# Patient Record
Sex: Female | Born: 1980 | Race: Black or African American | Hispanic: No | Marital: Single
Health system: Midwestern US, Community
[De-identification: ages and names within clinical notes are randomized; demographics above are authoritative.]

## PROBLEM LIST (undated history)

## (undated) DIAGNOSIS — M329 Systemic lupus erythematosus, unspecified: Secondary | ICD-10-CM

## (undated) DIAGNOSIS — G894 Chronic pain syndrome: Secondary | ICD-10-CM

## (undated) DIAGNOSIS — IMO0002 Reserved for concepts with insufficient information to code with codable children: Secondary | ICD-10-CM

## (undated) DIAGNOSIS — J301 Allergic rhinitis due to pollen: Secondary | ICD-10-CM

## (undated) DIAGNOSIS — R52 Pain, unspecified: Secondary | ICD-10-CM

## (undated) DIAGNOSIS — D500 Iron deficiency anemia secondary to blood loss (chronic): Secondary | ICD-10-CM

## (undated) DIAGNOSIS — I10 Essential (primary) hypertension: Secondary | ICD-10-CM

## (undated) DIAGNOSIS — R000 Tachycardia, unspecified: Secondary | ICD-10-CM

## (undated) DIAGNOSIS — Z862 Personal history of diseases of the blood and blood-forming organs and certain disorders involving the immune mechanism: Secondary | ICD-10-CM

## (undated) DIAGNOSIS — R110 Nausea: Secondary | ICD-10-CM

## (undated) DIAGNOSIS — F5101 Primary insomnia: Secondary | ICD-10-CM

## (undated) DIAGNOSIS — Z888 Allergy status to other drugs, medicaments and biological substances status: Principal | ICD-10-CM

## (undated) DIAGNOSIS — D649 Anemia, unspecified: Secondary | ICD-10-CM

## (undated) DIAGNOSIS — Z85830 Personal history of malignant neoplasm of bone: Secondary | ICD-10-CM

## (undated) DIAGNOSIS — E782 Mixed hyperlipidemia: Secondary | ICD-10-CM

## (undated) DIAGNOSIS — Z87898 Personal history of other specified conditions: Secondary | ICD-10-CM

## (undated) DIAGNOSIS — Z01818 Encounter for other preprocedural examination: Secondary | ICD-10-CM

## (undated) DIAGNOSIS — I7300 Raynaud's syndrome without gangrene: Secondary | ICD-10-CM

## (undated) DIAGNOSIS — E559 Vitamin D deficiency, unspecified: Secondary | ICD-10-CM

## (undated) DIAGNOSIS — M62838 Other muscle spasm: Principal | ICD-10-CM

## (undated) DIAGNOSIS — R569 Unspecified convulsions: Principal | ICD-10-CM

## (undated) DIAGNOSIS — F419 Anxiety disorder, unspecified: Secondary | ICD-10-CM

## (undated) DIAGNOSIS — S81802A Unspecified open wound, left lower leg, initial encounter: Secondary | ICD-10-CM

## (undated) DIAGNOSIS — R5382 Chronic fatigue, unspecified: Principal | ICD-10-CM

## (undated) DIAGNOSIS — D509 Iron deficiency anemia, unspecified: Secondary | ICD-10-CM

## (undated) DIAGNOSIS — G4701 Insomnia due to medical condition: Secondary | ICD-10-CM

---

## 2010-07-26 IMAGING — CR DG SHOULDER 3+V*L*
1 series · 3 of 3 positions shown · non-contrast
Comparison: none

REASON FOR EXAM: pain, unable o lift arm
COMMENTS:

[Series 1: view not recorded · 0.17mm/px · 3 of 3 slices shown]
[im 1/3]
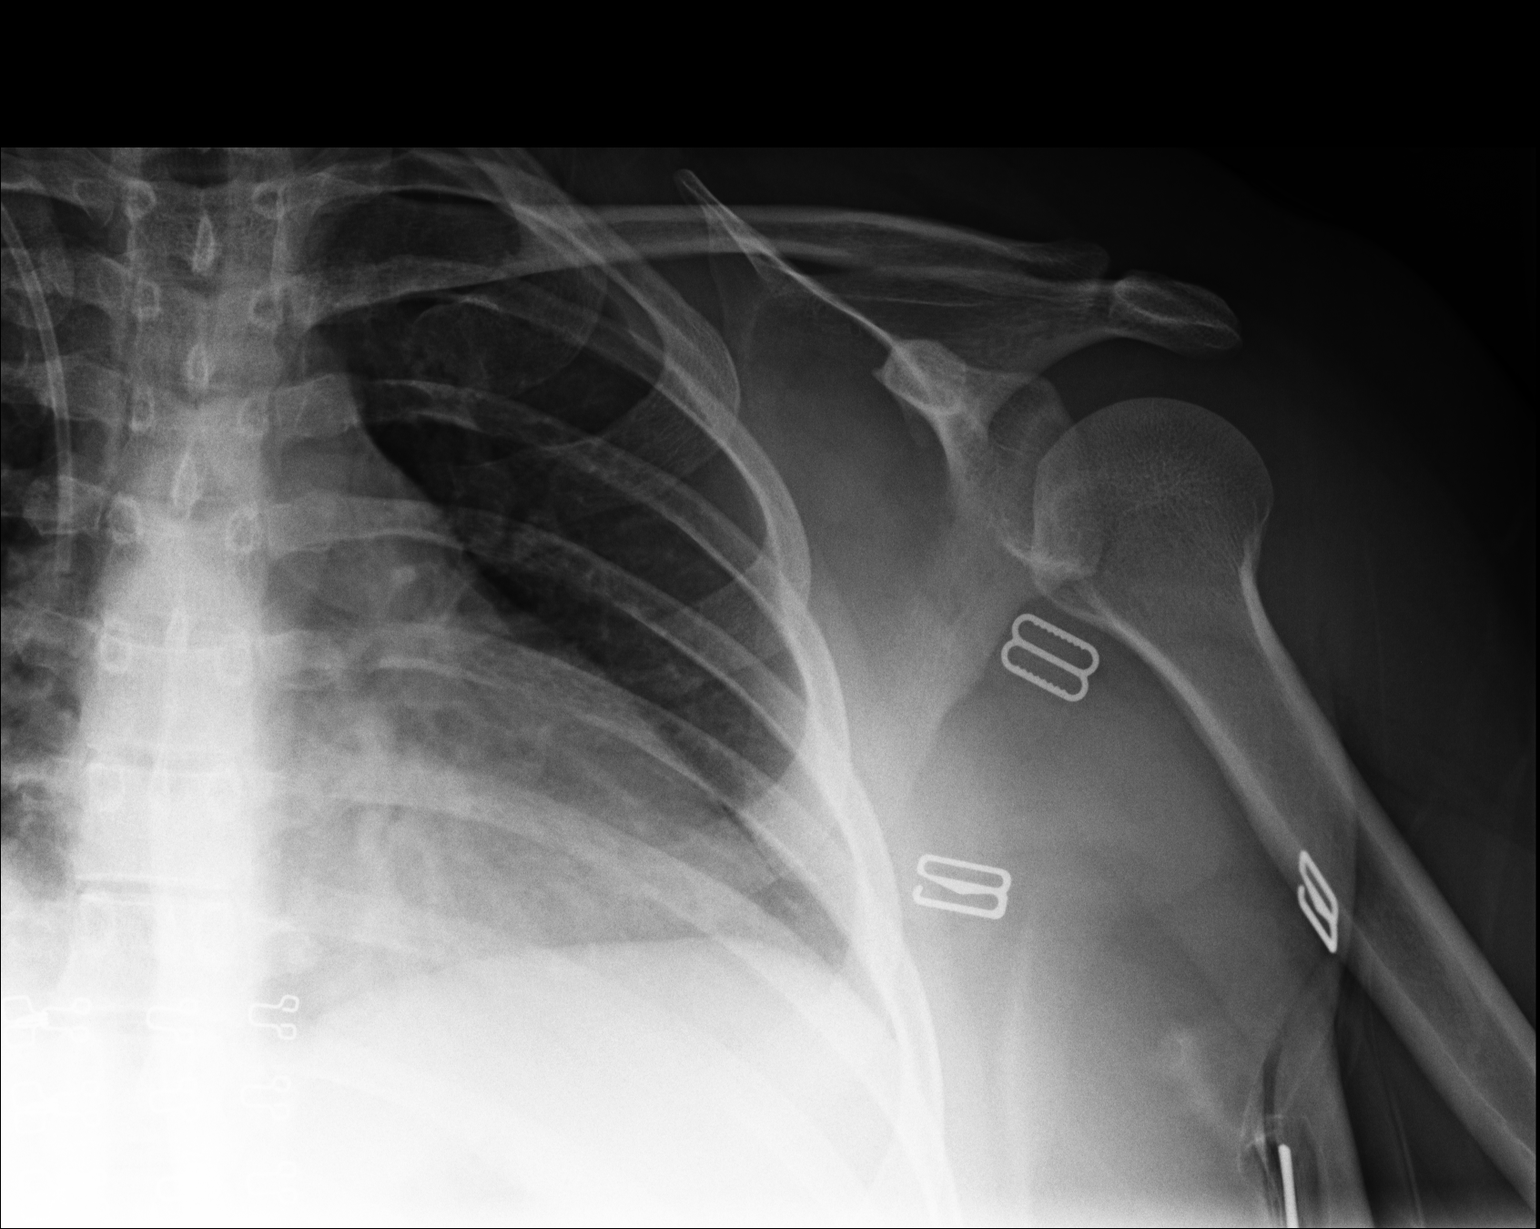
[im 2/3]
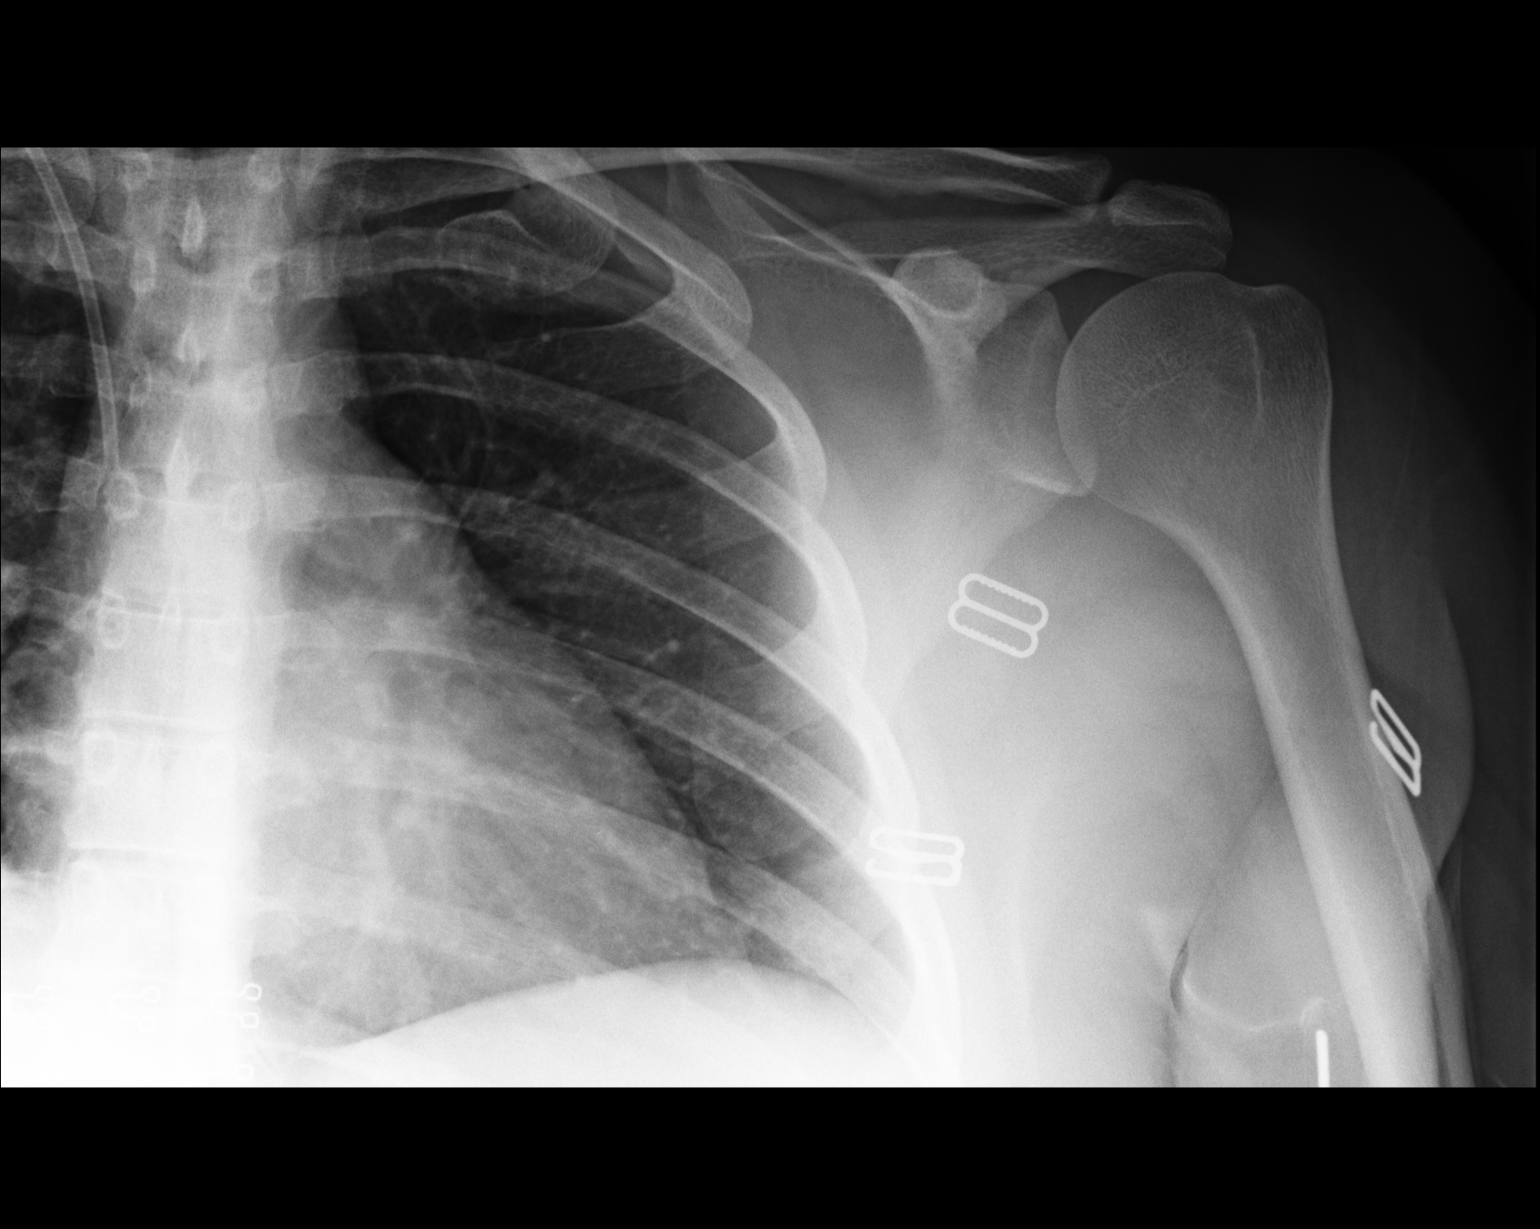
[im 3/3]
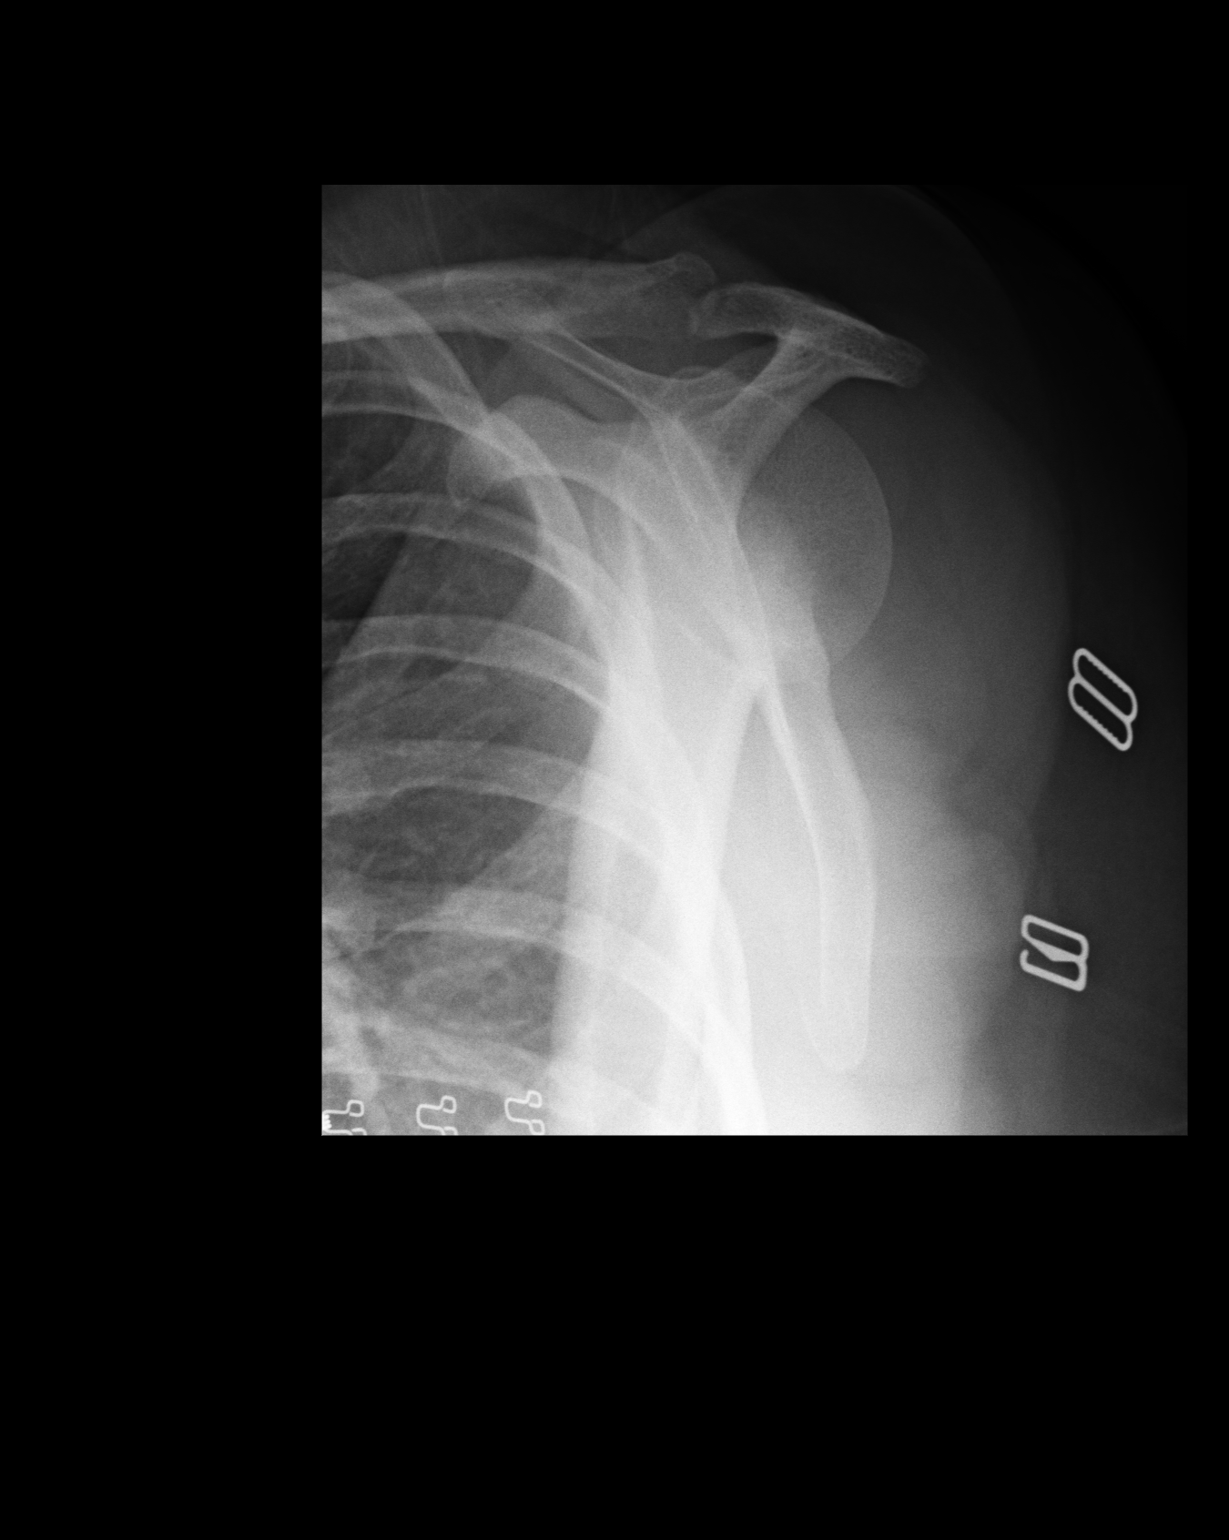

[3 of 3 positions shown; findings below may reference images not displayed]

PROCEDURE:     MDR - MDR SHOULDER LEFT COMPLETE  - January 29, 2009  [DATE]

RESULT:     There does not appear to be evidence of fracture, dislocation,
or malalignment.  The LEFT lung apex is unremarkable. If there is persistent
complaints of pain, repeat evaluation in 7-10 days is recommended or,
alternatively further evaluation with MRI.
IMPRESSION: 1. Unremarkable LEFT shoulder.

## 2021-05-15 ENCOUNTER — Inpatient Hospital Stay
Admit: 2021-05-15 | Discharge: 2021-05-15 | Disposition: A | Discharge status: Home / Self Care | Payer: Medicaid (Managed Care) | Attending: Emergency Medicine

## 2021-05-15 ENCOUNTER — Emergency Department: Admit: 2021-05-15 | Payer: Medicaid (Managed Care)

## 2021-05-15 DIAGNOSIS — S81812A Laceration without foreign body, left lower leg, initial encounter: Secondary | ICD-10-CM

## 2021-05-15 LAB — CBC WITH AUTO DIFFERENTIAL
Absolute Eos #: 0.1 10*3/uL (ref 0.0–0.8)
Absolute Immature Granulocyte: 0 10*3/uL (ref 0.0–0.5)
Absolute Lymph #: 1.3 10*3/uL (ref 0.5–4.6)
Absolute Mono #: 0.5 10*3/uL (ref 0.1–1.3)
Basophils Absolute: 0 10*3/uL (ref 0.0–0.2)
Basophils: 1 % (ref 0.0–2.0)
Eosinophils %: 2 % (ref 0.5–7.8)
Hematocrit: 35.7 % — ABNORMAL LOW (ref 35.8–46.3)
Hemoglobin: 11.2 g/dL — ABNORMAL LOW (ref 11.7–15.4)
Immature Granulocytes: 0 % (ref 0.0–5.0)
Lymphocytes: 25 % (ref 13–44)
MCH: 29.5 PG (ref 26.1–32.9)
MCHC: 31.4 g/dL (ref 31.4–35.0)
MCV: 93.9 FL (ref 79.6–97.8)
MPV: 10.4 FL (ref 9.4–12.3)
Monocytes: 10 % (ref 4.0–12.0)
Platelets: 344 10*3/uL (ref 150–450)
RBC: 3.8 M/uL — ABNORMAL LOW (ref 4.05–5.2)
RDW: 13.6 % (ref 11.9–14.6)
Seg Neutrophils: 62 % (ref 43–78)
Segs Absolute: 3.2 10*3/uL (ref 1.7–8.2)
WBC: 5.2 10*3/uL (ref 4.3–11.1)
nRBC: 0 10*3/uL (ref 0.0–0.2)

## 2021-05-15 LAB — BASIC METABOLIC PANEL
Anion Gap: 2 mmol/L — ABNORMAL LOW (ref 4–13)
BUN: 11 MG/DL (ref 6–23)
CO2: 28 mmol/L (ref 21–32)
Calcium: 9.8 MG/DL (ref 8.3–10.4)
Chloride: 108 mmol/L (ref 101–110)
Creatinine: 0.8 MG/DL (ref 0.6–1.0)
GFR African American: >60 mL/min/{1.73_m2} (ref >60)
GFR Non-African American: >60 mL/min/{1.73_m2} (ref >60)
Glucose: 107 mg/dL — ABNORMAL HIGH (ref 65–100)
Potassium: 4.6 mmol/L (ref 3.5–5.1)
Sodium: 138 mmol/L (ref 136–145)

## 2021-05-15 MED ORDER — LORAZEPAM 2 MG/ML IJ SOLN
2 MG/ML | Freq: Once | INTRAMUSCULAR | Status: AC
Start: 2021-05-15 — End: 2021-05-15
  Administered 2021-05-15: 14:00:00 1 mg via INTRAVENOUS

## 2021-05-15 MED ORDER — SODIUM CHLORIDE 0.9 % IV BOLUS
0.9 % | Freq: Once | INTRAVENOUS | Status: AC | PRN
Start: 2021-05-15 — End: 2021-05-15
  Administered 2021-05-15: 18:00:00 100 mL via INTRAVENOUS

## 2021-05-15 MED ORDER — MORPHINE SULFATE 2 MG/ML IJ SOLN
2 MG/ML | Freq: Once | INTRAMUSCULAR | Status: AC
Start: 2021-05-15 — End: 2021-05-15
  Administered 2021-05-15: 15:00:00 4 mg via INTRAVENOUS

## 2021-05-15 MED ORDER — NORMAL SALINE FLUSH 0.9 % IV SOLN
0.9 % | Freq: Once | INTRAVENOUS | Status: AC | PRN
Start: 2021-05-15 — End: 2021-05-15
  Administered 2021-05-15: 18:00:00 10 mL via INTRAVENOUS

## 2021-05-15 MED ORDER — ORPHENADRINE CITRATE ER 100 MG PO TB12
100 MG | Freq: Two times a day (BID) | ORAL | Status: DC
Start: 2021-05-15 — End: 2021-05-15
  Administered 2021-05-15: 14:00:00 100 mg via ORAL

## 2021-05-15 MED ORDER — CEPHALEXIN 500 MG PO CAPS
500 MG | ORAL_CAPSULE | Freq: Two times a day (BID) | ORAL | 0 refills | Status: DC
Start: 2021-05-15 — End: 2021-05-17

## 2021-05-15 MED ORDER — LIDOCAINE-EPINEPHRINE 1 %-1:200000 IJ SOLN
1 percent-:200000 | Freq: Once | INTRAMUSCULAR | Status: AC
Start: 2021-05-15 — End: 2021-05-15
  Administered 2021-05-15: 14:00:00 30 mL via INTRADERMAL

## 2021-05-15 MED ORDER — LACTATED RINGERS IV BOLUS
Freq: Once | INTRAVENOUS | Status: AC
Start: 2021-05-15 — End: 2021-05-15
  Administered 2021-05-15: 14:00:00 1000 mL via INTRAVENOUS

## 2021-05-15 MED ORDER — HYDROMORPHONE HCL PF 1 MG/ML IJ SOLN
1 MG/ML | INTRAMUSCULAR | Status: AC
Start: 2021-05-15 — End: 2021-05-15
  Administered 2021-05-15: 17:00:00 1 mg via INTRAVENOUS

## 2021-05-15 MED ORDER — HYDROCODONE-ACETAMINOPHEN 5-325 MG PO TABS
5-325 MG | ORAL_TABLET | Freq: Four times a day (QID) | ORAL | 0 refills | Status: AC | PRN
Start: 2021-05-15 — End: 2021-05-18

## 2021-05-15 MED ORDER — CLINDAMYCIN PHOSPHATE IN D5W 600 MG/50ML IV SOLN
60050 MG/50ML | INTRAVENOUS | Status: AC
Start: 2021-05-15 — End: 2021-05-15
  Administered 2021-05-15: 19:00:00 600 mg via INTRAVENOUS

## 2021-05-15 MED ORDER — DROPERIDOL 2.5 MG/ML IJ SOLN
2.5 MG/ML | Freq: Once | INTRAMUSCULAR | Status: AC
Start: 2021-05-15 — End: 2021-05-15
  Administered 2021-05-15: 17:00:00 5 mg via INTRAVENOUS

## 2021-05-15 MED ORDER — MORPHINE SULFATE 4 MG/ML IV SOLN
4 MG/ML | Freq: Once | INTRAVENOUS | Status: AC
Start: 2021-05-15 — End: 2021-05-15
  Administered 2021-05-15: 14:00:00 4 mg via INTRAVENOUS

## 2021-05-15 MED ORDER — CLINDAMYCIN HCL 300 MG PO CAPS
300 MG | ORAL_CAPSULE | Freq: Two times a day (BID) | ORAL | 0 refills | Status: DC
Start: 2021-05-15 — End: 2021-05-17

## 2021-05-15 MED ORDER — IOPAMIDOL 76 % IV SOLN
76 % | Freq: Once | INTRAVENOUS | Status: AC | PRN
Start: 2021-05-15 — End: 2021-05-15
  Administered 2021-05-15: 18:00:00 100 mL via INTRAVENOUS

## 2021-05-15 MED ORDER — DIPHENHYDRAMINE HCL 50 MG/ML IJ SOLN
50 MG/ML | Freq: Once | INTRAMUSCULAR | Status: AC
Start: 2021-05-15 — End: 2021-05-15
  Administered 2021-05-15: 17:00:00 50 mg via INTRAVENOUS

## 2021-05-15 MED FILL — CLINDAMYCIN PHOSPHATE IN D5W 600 MG/50ML IV SOLN: 600 MG/50ML | INTRAVENOUS | Qty: 50

## 2021-05-15 MED FILL — ORPHENADRINE CITRATE ER 100 MG PO TB12: 100 MG | ORAL | Qty: 1

## 2021-05-15 MED FILL — LORAZEPAM 2 MG/ML IJ SOLN: 2 MG/ML | INTRAMUSCULAR | Qty: 1

## 2021-05-15 MED FILL — MORPHINE SULFATE 2 MG/ML IJ SOLN: 2 mg/mL | INTRAMUSCULAR | Qty: 2

## 2021-05-15 MED FILL — MORPHINE SULFATE 4 MG/ML IJ SOLN: 4 mg/mL | INTRAMUSCULAR | Qty: 1

## 2021-05-15 MED FILL — DROPERIDOL 2.5 MG/ML IJ SOLN: 2.5 MG/ML | INTRAMUSCULAR | Qty: 2

## 2021-05-15 MED FILL — XYLOCAINE-MPF/EPINEPHRINE 1 %-1:200000 IJ SOLN: 1 %-:200000 | INTRAMUSCULAR | Qty: 30

## 2021-05-15 MED FILL — DIPHENHYDRAMINE HCL 50 MG/ML IJ SOLN: 50 MG/ML | INTRAMUSCULAR | Qty: 1

## 2021-05-15 MED FILL — HYDROMORPHONE HCL 1 MG/ML IJ SOLN: 1 MG/ML | INTRAMUSCULAR | Qty: 1
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
## 2021-05-15 NOTE — Discharge Instructions (Addendum)
We have given you follow-up with our plastic surgeon on-call Dr. Allene Dillon.  I have written you for clindamycin for home which is an antibiotic you take twice a day for 7 days.  Please return the emergency department for any fevers vomiting red streaking up your leg or worsening pain.  Have also written you for some pain medication at home.  Please be aware that this medication is addicting you should only use ibuprofen and Tylenol and this medicine for breakthrough pain.  You should also be on a daily stool softener when taking this medication.  I recommend you are nonweightbearing to allow this wound to heal.

## 2021-05-15 NOTE — ED Notes (Signed)
I have reviewed discharge instructions with the patient and friend.  The patient and friend verbalized understanding.    Patient left ED via Discharge Method: wheelchair to Home with friend.    Opportunity for questions and clarification provided.       Patient given 2 scripts.         To continue your aftercare when you leave the hospital, you may receive an automated call from our care team to check in on how you are doing.  This is a free service and part of our promise to provide the best care and service to meet your aftercare needs.??? If you have questions, or wish to unsubscribe from this service please call [phone].  Thank you for Choosing our [facility] Emergency Department.        Marylyn Ishihara, RN  05/15/21 [phone]
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
## 2021-05-17 ENCOUNTER — Inpatient Hospital Stay
Admit: 2021-05-17 | Discharge: 2021-05-18 | Disposition: A | Discharge status: Home / Self Care | Payer: Medicaid (Managed Care) | Attending: Emergency Medicine

## 2021-05-17 DIAGNOSIS — Z4800 Encounter for change or removal of nonsurgical wound dressing: Secondary | ICD-10-CM

## 2021-05-17 LAB — CBC WITH AUTO DIFFERENTIAL
Absolute Eos #: 0.1 10*3/uL (ref 0.0–0.8)
Absolute Immature Granulocyte: 0 10*3/uL (ref 0.0–0.5)
Absolute Lymph #: 1.2 10*3/uL (ref 0.5–4.6)
Absolute Mono #: 0.4 10*3/uL (ref 0.1–1.3)
Basophils Absolute: 0 10*3/uL (ref 0.0–0.2)
Basophils: 1 % (ref 0.0–2.0)
Eosinophils %: 2 % (ref 0.5–7.8)
Hematocrit: 34 % — ABNORMAL LOW (ref 35.8–46.3)
Hemoglobin: 10.3 g/dL — ABNORMAL LOW (ref 11.7–15.4)
Immature Granulocytes: 0 % (ref 0.0–5.0)
Lymphocytes: 19 % (ref 13–44)
MCH: 29.1 PG (ref 26.1–32.9)
MCHC: 30.3 g/dL — ABNORMAL LOW (ref 31.4–35.0)
MCV: 96 FL (ref 79.6–97.8)
MPV: 11.1 FL (ref 9.4–12.3)
Monocytes: 7 % (ref 4.0–12.0)
Platelets: 364 10*3/uL (ref 150–450)
RBC: 3.54 M/uL — ABNORMAL LOW (ref 4.05–5.2)
RDW: 13.5 % (ref 11.9–14.6)
Seg Neutrophils: 71 % (ref 43–78)
Segs Absolute: 4.4 10*3/uL (ref 1.7–8.2)
WBC: 6.1 10*3/uL (ref 4.3–11.1)
nRBC: 0 10*3/uL (ref 0.0–0.2)

## 2021-05-17 LAB — COMPREHENSIVE METABOLIC PANEL
ALT: 22 U/L (ref 12–65)
AST: 12 U/L — ABNORMAL LOW (ref 15–37)
Albumin/Globulin Ratio: 0.9 — ABNORMAL LOW (ref 1.2–3.5)
Albumin: 3.5 g/dL (ref 3.5–5.0)
Alk Phosphatase: 75 U/L (ref 50–136)
Anion Gap: 5 mmol/L (ref 4–13)
BUN: 10 MG/DL (ref 6–23)
CO2: 25 mmol/L (ref 21–32)
Calcium: 9.4 MG/DL (ref 8.3–10.4)
Chloride: 107 mmol/L (ref 101–110)
Creatinine: 0.9 MG/DL (ref 0.6–1.0)
GFR African American: >60 mL/min/{1.73_m2} (ref >60)
GFR Non-African American: >60 mL/min/{1.73_m2} (ref >60)
Globulin: 4.1 g/dL — ABNORMAL HIGH (ref 2.3–3.5)
Glucose: 89 mg/dL (ref 65–100)
Potassium: 4.1 mmol/L (ref 3.5–5.1)
Sodium: 137 mmol/L (ref 136–145)
Total Bilirubin: 0.2 MG/DL (ref 0.2–1.1)
Total Protein: 7.6 g/dL (ref 6.3–8.2)

## 2021-05-17 LAB — LACTIC ACID: Lactic Acid, Plasma: 2 MMOL/L (ref 0.4–2.0)

## 2021-05-17 LAB — PROCALCITONIN: Procalcitonin: <0.05 ng/mL (ref 0.00–0.49)

## 2021-05-17 MED ORDER — CEPHALEXIN 500 MG PO CAPS
500 MG | ORAL_CAPSULE | Freq: Two times a day (BID) | ORAL | 0 refills | Status: AC
Start: 2021-05-17 — End: 2021-05-20

## 2021-05-17 MED ORDER — HYDROMORPHONE HCL PF 1 MG/ML IJ SOLN
1 MG/ML | INTRAMUSCULAR | Status: AC
Start: 2021-05-17 — End: 2021-05-17
  Administered 2021-05-17: 0.5 mg via INTRAVENOUS

## 2021-05-17 MED ORDER — SODIUM CHLORIDE 0.9 % IV BOLUS
0.9 % | INTRAVENOUS | Status: AC
Start: 2021-05-17 — End: 2021-05-17
  Administered 2021-05-17: 20:00:00 1000 mL via INTRAVENOUS

## 2021-05-17 MED ORDER — DIAZEPAM 5 MG/ML IJ SOLN
5 MG/ML | Freq: Once | INTRAMUSCULAR | Status: AC
Start: 2021-05-17 — End: 2021-05-17
  Administered 2021-05-17: 20:00:00 2.5 mg via INTRAVENOUS

## 2021-05-17 MED ORDER — DIAZEPAM 5 MG/ML IJ SOLN
5 MG/ML | INTRAMUSCULAR | Status: DC | PRN
Start: 2021-05-17 — End: 2021-05-17
  Administered 2021-05-17: 21:00:00 2.5 mg via INTRAVENOUS

## 2021-05-17 MED ORDER — HYDROMORPHONE HCL PF 1 MG/ML IJ SOLN
1 MG/ML | INTRAMUSCULAR | Status: AC
Start: 2021-05-17 — End: 2021-05-17
  Administered 2021-05-17: 21:00:00 1 mg via INTRAVENOUS

## 2021-05-17 MED ORDER — DIAZEPAM 2 MG PO TABS
2 MG | Freq: Four times a day (QID) | ORAL | Status: DC | PRN
Start: 2021-05-17 — End: 2021-05-17
  Administered 2021-05-17: 2 mg via ORAL

## 2021-05-17 MED ORDER — BACLOFEN 10 MG PO TABS
10 MG | ORAL_TABLET | Freq: Three times a day (TID) | ORAL | 0 refills | Status: AC
Start: 2021-05-17 — End: 2021-05-22

## 2021-05-17 MED ORDER — HYDROMORPHONE HCL PF 1 MG/ML IJ SOLN
1 MG/ML | INTRAMUSCULAR | Status: AC
Start: 2021-05-17 — End: 2021-05-17
  Administered 2021-05-17: 23:00:00 0.5 mg via INTRAVENOUS

## 2021-05-17 MED ORDER — OXYCODONE HCL 5 MG PO TABS
5 MG | ORAL_TABLET | Freq: Four times a day (QID) | ORAL | 0 refills | Status: AC | PRN
Start: 2021-05-17 — End: 2021-05-20

## 2021-05-17 MED ORDER — DIPHENHYDRAMINE HCL 50 MG/ML IJ SOLN
50 MG/ML | Freq: Four times a day (QID) | INTRAMUSCULAR | Status: DC | PRN
Start: 2021-05-17 — End: 2021-05-17
  Administered 2021-05-17: 23:00:00 25 mg via INTRAVENOUS

## 2021-05-17 MED ORDER — BACLOFEN 10 MG PO TABS
10 MG | ORAL | Status: AC
Start: 2021-05-17 — End: 2021-05-17
  Administered 2021-05-17: 22:00:00 5 mg via ORAL

## 2021-05-17 MED ORDER — CLINDAMYCIN HCL 300 MG PO CAPS
300 MG | ORAL_CAPSULE | Freq: Two times a day (BID) | ORAL | 0 refills | Status: AC
Start: 2021-05-17 — End: 2021-05-20

## 2021-05-17 MED ORDER — HYDROMORPHONE HCL PF 1 MG/ML IJ SOLN
1 MG/ML | Freq: Once | INTRAMUSCULAR | Status: AC
Start: 2021-05-17 — End: 2021-05-17
  Administered 2021-05-17: 20:00:00 0.25 mg via INTRAVENOUS

## 2021-05-17 MED FILL — HYDROMORPHONE HCL 1 MG/ML IJ SOLN: 1 MG/ML | INTRAMUSCULAR | Qty: 1

## 2021-05-17 MED FILL — DIAZEPAM 5 MG/ML IJ SOLN: 5 MG/ML | INTRAMUSCULAR | Qty: 2

## 2021-05-17 MED FILL — DIPHENHYDRAMINE HCL 50 MG/ML IJ SOLN: 50 MG/ML | INTRAMUSCULAR | Qty: 1

## 2021-05-17 MED FILL — BACLOFEN 10 MG PO TABS: 10 MG | ORAL | Qty: 1

## 2021-05-17 MED FILL — DIAZEPAM 2 MG PO TABS: 2 MG | ORAL | Qty: 1

## 2021-05-17 NOTE — ED Triage Notes (Cosign Needed)
41 year old female patient presents today for a wound check after having multiple sutures placed on her left lower leg 2 days ago.  She said since then her leg has been nonstop twitching.  She also states that it is starting to turn black and is very painful.  She denies any red streaking up her leg.  Denies fever or chills.    Physical exam shows middle-aged female in mild pain.  Left lower leg is nonstop repeatedly twitching and fasciculating.  Pictures shown of leg to show necrosis over area of flap.    Patient evaluated initially in triage.  Rapid Medical Evaluation was conducted and necessary orders have been placed.  I have performed a medical screening exam.  Care will now be transferred to the provider in the back of the emergency department.  Dontaye Hur, PA 12:22 PM

## 2021-05-17 NOTE — Discharge Instructions (Signed)
Recheck with any fever or unusual drainage  Complete 10-day course of both antibiotics  Use probiotic or culture active yogurt  There is an area of darkened tissue that may fail but that can be followed up with plastic surgery/wound care as it defines itself

## 2021-05-17 NOTE — ED Triage Notes (Signed)
Pt arrives via POV coming from home c/o complications from sutures that were placed on Tuesday. Pt recently fell and came to this facility. Pt states she was instructed to return with worsening pain or with additional swelling. Pt states "part of the wound is black". No other complaints at time of triage.
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
## 2021-05-22 LAB — CULTURE, BLOOD 1: Culture: NO GROWTH

## 2021-05-31 ENCOUNTER — Encounter: Payer: BLUE CROSS/BLUE SHIELD | Attending: Family Medicine

## 2021-06-29 NOTE — Telephone Encounter (Signed)
Formatting of this note might be different from the original.  Has appointment Dr Althia Forts Nov 7.  js  Electronically signed by Carola Rhine, MD at 06/29/2021 10:41 AM EDT

## 2021-07-02 ENCOUNTER — Inpatient Hospital Stay: Admit: 2021-07-02 | Payer: BLUE CROSS/BLUE SHIELD

## 2021-07-02 DIAGNOSIS — Z01812 Encounter for preprocedural laboratory examination: Secondary | ICD-10-CM

## 2021-07-02 LAB — HEMOGLOBIN: Hemoglobin: 9.9 g/dL — ABNORMAL LOW (ref 11.7–15.4)

## 2021-07-02 NOTE — Other (Signed)
Phone pre-assessment completed.    Verified name&  DOB. Order to obtain consent NOT found in EHR, however patient verifies case posting.    Type 2 surgery,  assessment complete.  Orders NOT received.    Labs per surgeon: unknown  Labs per anesthesia protocol: HGB- Pt to come to [address] today before 3:30pm. Chart marked for charge nurse review.    Medical/surgical history questions answered at their best of ability. All prior to admission medications reviewed and documented in [facility].    Instructed to take ONLY THE FOLLOWING MEDICATIONS ON THE DAY OF SURGERY according to anesthesia guidelines with sips of water: sucralfate, gabapentin, if needed zofran.      VERBALIZES UNDERSTANDING TO HOLD ALL VITAMINS AND SUPPLEMENTS and NSAIDS IMMEDIATELY PER ANESTHESIA PROTOCOL.    Instructed on the following:    > Arrive at [facility], time of arrival to be called the day before by 1700  > NPO after midnight including gum, mints, and ice chips  > Responsible adult must drive patient to the hospital, stay during surgery, and patient will need supervision 24 hours after anesthesia  > Use antibacterial soap in shower the night before surgery and on the morning of surgery  > All piercings must be removed prior to arrival.    > Leave all valuables (money and jewelry) at home but bring insurance card and ID on DOS.   > You may be required to pay a deductible or co-pay on the day of your procedure. You can pre-pay by calling [phone] if your surgery is at the [facility] or [phone] if your surgery is at the [facility].  > Do not wear make-up, nail polish, lotions, cologne, perfumes, powders, or oil on skin. Artificial nails are not permitted.     Teach back successful and demonstrates knowledge of instruction.    You will received a call from the pre-op nurse by 5 pm on the business day prior to the scheduled procedure. If you have not spoken with a nurse, please check your voicemail. If you  have not received an arrival time by 5 pm, please call [phone].

## 2021-07-02 NOTE — Other (Addendum)
The lab results below are within anesthesia guidelines, labs routed to surgeon per anesthesia protocol.      Hemoglobin  Order: [id number]  Status: Final result    Visible to patient: No (not released)    Next appt: None    0 Result Notes  Component Ref Range & Units 07/02/21 1140    Hemoglobin 11.7 - 15.4 g/dL 9.9 Low     Resulting [place]              Specimen Collected: 07/02/21 11:40 EST Last Resulted: 07/02/21 12:49 EST

## 2021-07-04 ENCOUNTER — Inpatient Hospital Stay: Payer: BLUE CROSS/BLUE SHIELD

## 2021-07-04 LAB — POC PREGNANCY UR-QUAL: Preg Test, Ur: NEGATIVE

## 2021-07-04 MED ORDER — FENTANYL CITRATE (PF) 100 MCG/2ML IJ SOLN
1002 MCG/2ML | INTRAMUSCULAR | Status: AC
Start: 2021-07-04 — End: ?

## 2021-07-04 MED ORDER — ACETAMINOPHEN 500 MG PO TABS
500 MG | Freq: Once | ORAL | Status: AC
Start: 2021-07-04 — End: 2021-07-04
  Administered 2021-07-04: 11:00:00 1000 mg via ORAL

## 2021-07-04 MED ORDER — LIDOCAINE HCL (PF) 0.5 % IJ SOLN
0.5 % | INTRAMUSCULAR | Status: AC
Start: 2021-07-04 — End: ?

## 2021-07-04 MED ORDER — MIDAZOLAM HCL (PF) 2 MG/2ML IJ SOLN
22 MG/ML | Freq: Once | INTRAMUSCULAR | Status: AC | PRN
Start: 2021-07-04 — End: 2021-07-04
  Administered 2021-07-04: 12:00:00 2 mg via INTRAVENOUS

## 2021-07-04 MED ORDER — DIPHENHYDRAMINE HCL 50 MG/ML IJ SOLN
50 MG/ML | Freq: Once | INTRAMUSCULAR | Status: DC | PRN
Start: 2021-07-04 — End: 2021-07-04

## 2021-07-04 MED ORDER — DEXAMETHASONE SODIUM PHOSPHATE 10 MG/ML IJ SOLN
10 MG/ML | INTRAMUSCULAR | Status: DC | PRN
Start: 2021-07-04 — End: 2021-07-04
  Administered 2021-07-04: 13:00:00 10 via INTRAVENOUS

## 2021-07-04 MED ORDER — SODIUM CHLORIDE 0.9 % IV SOLN
0.9 % | INTRAVENOUS | Status: DC | PRN
Start: 2021-07-04 — End: 2021-07-04

## 2021-07-04 MED ORDER — ONDANSETRON HCL 4 MG/2ML IJ SOLN
42 MG/2ML | INTRAMUSCULAR | Status: DC | PRN
Start: 2021-07-04 — End: 2021-07-04
  Administered 2021-07-04: 13:00:00 4 via INTRAVENOUS

## 2021-07-04 MED ORDER — NEOSTIGMINE METHYLSULFATE 3 MG/3ML IV SOSY
33 MG/ML | INTRAVENOUS | Status: DC | PRN
Start: 2021-07-04 — End: 2021-07-04
  Administered 2021-07-04: 15:00:00 2 via INTRAVENOUS

## 2021-07-04 MED ORDER — FENTANYL CITRATE (PF) 100 MCG/2ML IJ SOLN
100 MCG/2ML | Freq: Once | INTRAMUSCULAR | Status: DC | PRN
Start: 2021-07-04 — End: 2021-07-04

## 2021-07-04 MED ORDER — NORMAL SALINE FLUSH 0.9 % IV SOLN
0.9 % | INTRAVENOUS | Status: DC | PRN
Start: 2021-07-04 — End: 2021-07-04

## 2021-07-04 MED ORDER — FENTANYL CITRATE (PF) 100 MCG/2ML IJ SOLN
1002 MCG/2ML | INTRAMUSCULAR | Status: DC | PRN
Start: 2021-07-04 — End: 2021-07-04
  Administered 2021-07-04: 12:00:00 100 via INTRAVENOUS

## 2021-07-04 MED ORDER — EPHEDRINE SULFATE-NACL 50-0.9 MG/5ML-% IV SOSY
INTRAVENOUS | Status: AC
Start: 2021-07-04 — End: ?

## 2021-07-04 MED ORDER — GLUCOSE 4 G PO CHEW
4 g | ORAL | Status: DC | PRN
Start: 2021-07-04 — End: 2021-07-04

## 2021-07-04 MED ORDER — DIPHENHYDRAMINE HCL 50 MG/ML IJ SOLN
50 MG/ML | INTRAMUSCULAR | Status: AC
Start: 2021-07-04 — End: ?

## 2021-07-04 MED ORDER — NEOSTIGMINE METHYLSULFATE 3 MG/3ML IV SOSY
33 MG/ML | INTRAVENOUS | Status: AC
Start: 2021-07-04 — End: ?

## 2021-07-04 MED ORDER — LIDOCAINE HCL 1 % IJ SOLN
1 % | INTRAMUSCULAR | Status: AC
Start: 2021-07-04 — End: ?

## 2021-07-04 MED ORDER — ROCURONIUM BROMIDE 50 MG/5ML IV SOLN
505 MG/5ML | INTRAVENOUS | Status: DC | PRN
Start: 2021-07-04 — End: 2021-07-04
  Administered 2021-07-04: 12:00:00 50 via INTRAVENOUS

## 2021-07-04 MED ORDER — PROCHLORPERAZINE EDISYLATE 10 MG/2ML IJ SOLN
10 MG/2ML | Freq: Once | INTRAMUSCULAR | Status: DC | PRN
Start: 2021-07-04 — End: 2021-07-04

## 2021-07-04 MED ORDER — GLUCAGON HCL RDNA (DIAGNOSTIC) 1 MG IJ SOLR
1 MG | INTRAMUSCULAR | Status: DC | PRN
Start: 2021-07-04 — End: 2021-07-04

## 2021-07-04 MED ORDER — ROCURONIUM BROMIDE 50 MG/5ML IV SOLN
505 MG/5ML | INTRAVENOUS | Status: AC
Start: 2021-07-04 — End: ?

## 2021-07-04 MED ORDER — LACTATED RINGERS IV SOLN
INTRAVENOUS | Status: DC
Start: 2021-07-04 — End: 2021-07-04
  Administered 2021-07-04 (×2): via INTRAVENOUS

## 2021-07-04 MED ORDER — OXYCODONE-ACETAMINOPHEN 5-325 MG PO TABS
5-325 MG | ORAL_TABLET | ORAL | 0 refills | Status: AC | PRN
Start: 2021-07-04 — End: 2021-07-09

## 2021-07-04 MED ORDER — CEFAZOLIN 2000 MG IN 20 ML SWFI IV SYRINGE (PREMIX)
Freq: Once | Status: AC
Start: 2021-07-04 — End: 2021-07-04
  Administered 2021-07-04: 13:00:00 2000 mg via INTRAVENOUS

## 2021-07-04 MED ORDER — LIDOCAINE HCL (PF) 2 % IJ SOLN
2 % | INTRAMUSCULAR | Status: AC
Start: 2021-07-04 — End: ?

## 2021-07-04 MED ORDER — KETAMINE HCL 20 MG/2ML IJ SOSY
202 MG/2ML | INTRAMUSCULAR | Status: DC | PRN
Start: 2021-07-04 — End: 2021-07-04
  Administered 2021-07-04 (×2): 20 via INTRAVENOUS

## 2021-07-04 MED ORDER — SODIUM CHLORIDE 0.9 % IV SOLN
0.9 % | INTRAVENOUS | Status: AC
Start: 2021-07-04 — End: ?

## 2021-07-04 MED ORDER — LIDOCAINE HCL 1 % IJ SOLN
1 % | INTRAMUSCULAR | Status: DC | PRN
Start: 2021-07-04 — End: 2021-07-04
  Administered 2021-07-04: 13:00:00 180

## 2021-07-04 MED ORDER — ONDANSETRON HCL 4 MG/2ML IJ SOLN
42 MG/2ML | INTRAMUSCULAR | Status: AC
Start: 2021-07-04 — End: ?

## 2021-07-04 MED ORDER — APREPITANT 40 MG PO CAPS
40 MG | Freq: Once | ORAL | Status: AC
Start: 2021-07-04 — End: 2021-07-04
  Administered 2021-07-04: 11:00:00 40 mg via ORAL

## 2021-07-04 MED ORDER — DIPHENHYDRAMINE HCL 50 MG/ML IJ SOLN
50 MG/ML | INTRAMUSCULAR | Status: DC | PRN
Start: 2021-07-04 — End: 2021-07-04
  Administered 2021-07-04: 13:00:00 25 via INTRAVENOUS

## 2021-07-04 MED ORDER — NORMAL SALINE FLUSH 0.9 % IV SOLN
0.9 % | Freq: Two times a day (BID) | INTRAVENOUS | Status: DC
Start: 2021-07-04 — End: 2021-07-04

## 2021-07-04 MED ORDER — OXYCODONE HCL 5 MG PO TABS
5 MG | Freq: Once | ORAL | Status: AC | PRN
Start: 2021-07-04 — End: 2021-07-04
  Administered 2021-07-04: 16:00:00 5 mg via ORAL

## 2021-07-04 MED ORDER — SCOPOLAMINE 1 MG/3DAYS TD PT72
13 MG/3DAYS | Freq: Once | TRANSDERMAL | Status: DC
Start: 2021-07-04 — End: 2021-07-04
  Administered 2021-07-04: 12:00:00 1 via TRANSDERMAL

## 2021-07-04 MED ORDER — MINERAL OIL LIGHT OIL
Status: DC | PRN
Start: 2021-07-04 — End: 2021-07-04
  Administered 2021-07-04: 13:00:00 10 via TOPICAL

## 2021-07-04 MED ORDER — LIDOCAINE HCL (PF) 2 % IJ SOLN
2 % | INTRAMUSCULAR | Status: DC | PRN
Start: 2021-07-04 — End: 2021-07-04
  Administered 2021-07-04: 12:00:00 100 via INTRAVENOUS

## 2021-07-04 MED ORDER — EPINEPHRINE 1 MG/ML IJ SOLN (MIXTURES ONLY)
1 mg/mL | Status: DC | PRN
Start: 2021-07-04 — End: 2021-07-04
  Administered 2021-07-04: 13:00:00 3

## 2021-07-04 MED ORDER — LACTATED RINGERS IV SOLN
INTRAVENOUS | Status: DC
Start: 2021-07-04 — End: 2021-07-04

## 2021-07-04 MED ORDER — GLYCOPYRROLATE 0.4 MG/2ML IJ SOLN
0.42 MG/2ML | INTRAMUSCULAR | Status: DC | PRN
Start: 2021-07-04 — End: 2021-07-04
  Administered 2021-07-04: 15:00:00 .4 via INTRAVENOUS

## 2021-07-04 MED ORDER — PHENYLEPHRINE HCL 10 MG/ML IV SOLN
10 MG/ML | INTRAVENOUS | Status: DC | PRN
Start: 2021-07-04 — End: 2021-07-04
  Administered 2021-07-04 (×7): 100 via INTRAVENOUS

## 2021-07-04 MED ORDER — DEXTROSE 10 % IV BOLUS
INTRAVENOUS | Status: DC | PRN
Start: 2021-07-04 — End: 2021-07-04

## 2021-07-04 MED ORDER — PHENYLEPHRINE HCL 10 MG/ML IV SOLN
10 MG/ML | INTRAVENOUS | Status: AC
Start: 2021-07-04 — End: ?

## 2021-07-04 MED ORDER — DEXAMETHASONE SODIUM PHOSPHATE 10 MG/ML IJ SOLN
10 MG/ML | INTRAMUSCULAR | Status: AC
Start: 2021-07-04 — End: ?

## 2021-07-04 MED ORDER — DEXTROSE 10 % IV SOLN
10 % | INTRAVENOUS | Status: DC | PRN
Start: 2021-07-04 — End: 2021-07-04

## 2021-07-04 MED ORDER — MINERAL OIL LIGHT OIL
Status: AC
Start: 2021-07-04 — End: ?

## 2021-07-04 MED ORDER — PROPOFOL 200 MG/20ML IV EMUL
20020 MG/20ML | INTRAVENOUS | Status: DC | PRN
Start: 2021-07-04 — End: 2021-07-04
  Administered 2021-07-04: 12:00:00 200 via INTRAVENOUS

## 2021-07-04 MED ORDER — HYDROMORPHONE HCL PF 2 MG/ML IJ SOLN
2 MG/ML | INTRAMUSCULAR | Status: DC | PRN
Start: 2021-07-04 — End: 2021-07-04
  Administered 2021-07-04 (×3): 0.5 mg via INTRAVENOUS

## 2021-07-04 MED ORDER — EPHEDRINE SULFATE-NACL 50-0.9 MG/5ML-% IV SOSY
50-0.95- MG/5ML-% | INTRAVENOUS | Status: DC | PRN
Start: 2021-07-04 — End: 2021-07-04
  Administered 2021-07-04 (×3): 10 via INTRAVENOUS

## 2021-07-04 MED ORDER — GLYCOPYRROLATE 0.4 MG/2ML IJ SOLN
0.42 MG/2ML | INTRAMUSCULAR | Status: AC
Start: 2021-07-04 — End: ?

## 2021-07-04 MED ORDER — HYDROMORPHONE HCL 2 MG/ML IJ SOLN
2 MG/ML | INTRAMUSCULAR | Status: AC
Start: 2021-07-04 — End: ?

## 2021-07-04 MED ORDER — KETAMINE HCL 20 MG/2ML IJ SOSY
20 MG/2ML | INTRAMUSCULAR | Status: AC
Start: 2021-07-04 — End: ?

## 2021-07-04 MED ORDER — EPINEPHRINE PF 1 MG/ML IJ SOLN
1 MG/ML | INTRAMUSCULAR | Status: AC
Start: 2021-07-04 — End: ?

## 2021-07-04 MED ORDER — HYDROMORPHONE HCL 2 MG/ML IJ SOLN
2 MG/ML | INTRAMUSCULAR | Status: DC | PRN
Start: 2021-07-04 — End: 2021-07-04
  Administered 2021-07-04 (×2): 1 via INTRAVENOUS

## 2021-07-04 MED ORDER — CEPHALEXIN 500 MG PO CAPS
500 MG | ORAL_CAPSULE | Freq: Four times a day (QID) | ORAL | 0 refills | Status: AC
Start: 2021-07-04 — End: 2021-07-11

## 2021-07-04 MED ORDER — LIDOCAINE HCL 1 % IJ SOLN
1 % | Freq: Once | INTRAMUSCULAR | Status: DC | PRN
Start: 2021-07-04 — End: 2021-07-04

## 2021-07-04 MED ORDER — LACTATED RINGERS IV SOLN
INTRAVENOUS | Status: DC | PRN
Start: 2021-07-04 — End: 2021-07-04
  Administered 2021-07-04: 13:00:00 3000

## 2021-07-04 MED ORDER — PROPOFOL 200 MG/20ML IV EMUL
20020 MG/20ML | INTRAVENOUS | Status: AC
Start: 2021-07-04 — End: ?

## 2021-07-04 MED FILL — EPINEPHRINE PF 1 MG/ML IJ SOLN: 1 MG/ML | INTRAMUSCULAR | Qty: 3

## 2021-07-04 MED FILL — MURI-LUBE OIL: Qty: 10

## 2021-07-04 MED FILL — DEXAMETHASONE SODIUM PHOSPHATE 10 MG/ML IJ SOLN: 10 MG/ML | INTRAMUSCULAR | Qty: 1

## 2021-07-04 MED FILL — DIPHENHYDRAMINE HCL 50 MG/ML IJ SOLN: 50 MG/ML | INTRAMUSCULAR | Qty: 1

## 2021-07-04 MED FILL — MIDAZOLAM HCL 2 MG/2ML IJ SOLN: 2 MG/ML | INTRAMUSCULAR | Qty: 2

## 2021-07-04 MED FILL — XYLOCAINE-MPF 0.5 % IJ SOLN: 0.5 % | INTRAMUSCULAR | Qty: 50

## 2021-07-04 MED FILL — OXYCODONE HCL 5 MG PO TABS: 5 MG | ORAL | Qty: 1

## 2021-07-04 MED FILL — ROCURONIUM BROMIDE 50 MG/5ML IV SOLN: 50 MG/5ML | INTRAVENOUS | Qty: 5

## 2021-07-04 MED FILL — XYLOCAINE 1 % IJ SOLN: 1 % | INTRAMUSCULAR | Qty: 180

## 2021-07-04 MED FILL — FENTANYL CITRATE (PF) 100 MCG/2ML IJ SOLN: 100 MCG/2ML | INTRAMUSCULAR | Qty: 2

## 2021-07-04 MED FILL — SODIUM CHLORIDE 0.9 % IV SOLN: 0.9 % | INTRAVENOUS | Qty: 500

## 2021-07-04 MED FILL — HYDROMORPHONE HCL 2 MG/ML IJ SOLN: 2 MG/ML | INTRAMUSCULAR | Qty: 1

## 2021-07-04 MED FILL — PROPOFOL 200 MG/20ML IV EMUL: 200 MG/20ML | INTRAVENOUS | Qty: 20

## 2021-07-04 MED FILL — KETAMINE HCL 20 MG/2ML IJ SOSY: 20 MG/2ML | INTRAMUSCULAR | Qty: 6

## 2021-07-04 MED FILL — EPHEDRINE SULFATE-NACL 50-0.9 MG/5ML-% IV SOSY: INTRAVENOUS | Qty: 5

## 2021-07-04 MED FILL — CEFAZOLIN 2000 MG IN 20 ML SWFI IV SYRINGE (PREMIX): Qty: 2000

## 2021-07-04 MED FILL — NEOSTIGMINE METHYLSULFATE 3 MG/3ML IV SOSY: 3 MG/ML | INTRAVENOUS | Qty: 3

## 2021-07-04 MED FILL — SCOPOLAMINE 1 MG/3DAYS TD PT72: 1 MG/3DAYS | TRANSDERMAL | Qty: 1

## 2021-07-04 MED FILL — GLYCOPYRROLATE 0.4 MG/2ML IJ SOLN: 0.4 MG/2ML | INTRAMUSCULAR | Qty: 2

## 2021-07-04 MED FILL — LIDOCAINE HCL (PF) 2 % IJ SOLN: 2 % | INTRAMUSCULAR | Qty: 5

## 2021-07-04 MED FILL — ONDANSETRON HCL 4 MG/2ML IJ SOLN: 4 MG/2ML | INTRAMUSCULAR | Qty: 2

## 2021-07-04 MED FILL — PHENYLEPHRINE HCL (PRESSORS) 10 MG/ML IV SOLN: 10 MG/ML | INTRAVENOUS | Qty: 1

## 2021-07-04 MED FILL — TYLENOL EXTRA STRENGTH 500 MG PO TABS: 500 MG | ORAL | Qty: 2

## 2021-07-04 MED FILL — APREPITANT 40 MG PO CAPS: 40 MG | ORAL | Qty: 1

## 2021-07-04 NOTE — Anesthesia Procedure Notes (Signed)
Airway  Date/Time: 07/04/2021 7:25 AM  Urgency: elective    Airway not difficult    General Information and Staff    Patient location during procedure: OR  Resident/CRNA: Rosalie Gums, APRN - CRNA  Performed: resident/CRNA     Indications and Patient Condition  Indications for airway management: anesthesia  Spontaneous Ventilation: absent  Sedation level: deep  Preoxygenated: yes  Patient position: ramp  MILS maintained throughout  Mask difficulty assessment: vent by bag mask    Final Airway Details  Final airway type: endotracheal airway      Successful airway: ETT     Successful intubation technique: direct laryngoscopy  Endotracheal tube insertion site: oral  Blade: Macintosh  Blade size: #3  ETT size (mm): 7.0  Cormack-Lehane Classification: grade IIa - partial view of glottis  Placement verified by: chest auscultation and capnometry   Measured from: lips  ETT to lips (cm): 20  Number of attempts at approach: 1  Ventilation between attempts: bag mask  Number of other approaches attempted: 0    no
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
## 2021-07-04 NOTE — Op Note (Addendum)
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]    Post-Op Diagnosis: Same       Procedure:  Panniculectomy  Frull thickness skin graft from abdomen to left lower leg 20 x 30 cm      Surgeon(s):  Theodosia Blender, MD    Assistant:   * No surgical staff found *    Anesthesia: General    Estimated Blood Loss (mL): 50 ml    Complications: None immediate    Specimens:   * No specimens in log *    Implants:  * No implants in log *      Drains:   [REMOVED] Closed/Suction Drain Left;Inferior Hip Bulb (Removed)   Site Description Clean, dry & intact 07/04/21 1042   Dressing Status Clean, dry & intact 07/04/21 1042   Drainage Appearance Bloody 07/04/21 1042       [REMOVED] Closed/Suction Drain Right;Inferior Hip Bulb (Removed)   Site Description Clean, dry & intact 07/04/21 1042   Dressing Status Clean, dry & intact 07/04/21 1042   Drainage Appearance Bloody 07/04/21 1042       [REMOVED] Urinary Catheter 07/04/21 Foley (Removed)       Detailed Description of Procedure:     Prior to the procedure the patient was met in holding. Once again a discussion of the risks, benefits and alternatives was conducted including but not limited to bleeding, infection, failure of the surgery, need for further procedures, disappointing or unacceptable cosmesis, damage to adjacent structures was conducted. The patient again affirmed understanding and consent. The patient was marked in the upright and relaxed position. Patient brought to the OR, surgical timeout performed and anesthesia induced. Patient positioned and prepped and draped.    Attention first turned to the abdomen. Area tumesced with 3 L of Klein solution. Pneumatic dermatome used to harvest several thick partial thickness skin grafts at 16/1000 in. These were put aside and the panniculectomy  proceeded. Inferior incision made and dissection carried down to the abdominal wall. Umbilical stalk transected and upper incision made sharply.  Panniculus passed off the field. Wound irrigated and hemostasis obtained. Closure completed with fat fascial 2-0 PDO quill, deep dermal 3-0 vicryl, insorb stapler and skin staples.     Attention turned to the left leg. Previous areas of adhesed scar sharply excised and surrounding skin mobilized. Skin graft inset with interrupted staples and fenestrated. Wound vac applied to further secure.

## 2021-07-04 NOTE — Discharge Instructions (Addendum)
Keep dressings in place and dry until return to office  Keep left leg elevated whenever possible.  Empty drains twice daily. Measure output and record output. Bring drain output to your follow up appointment.  Replace gauze at abdominal site whenever possible.         How do I empty my [place]?  You or a family member will need to empty the JP drain a few times each day.  Usually you empty the JP drain when it is half full but follow caregiver's instructions closely.     Gather all the items you will need.   Clean urine collection cup or container to measure drainage.    Clean rubber or latex exam gloves.    Waterproof pad or bath towel.    Bowl of warm water, soap, washcloth, and hand towel.    Notebook to write down fluid amounts and information.    Wash your hands with warm water and soap. Dry your hands completely.  Put on clean gloves.  Unpin the drain tube and bulb from your clothing.  Place the waterproof pad or towel under the side where the drain is to soak up any spills.  Hold the bulb in one hand lower than the wound.  This will prevent drainage from flowing back into the tubing and wound.    With the other hand, remove the plug from the drainage spout. Tip the bulb upside down and squeeze the fluid into the collection cup.  Do not touch the tip of the spout with the mouth of the collection cup or anything else.  Empty contents into the contain and then squeeze the bulb down- this re-creates the suction inside the bulb. Replace plug to complete the negative pressure to allow bulb to pull drainage from body. Do not squeeze the bulb if the plug is in place.     Re-pin the tape tab of the drain tubing and bulb back to your clothing.  Gently wash any areas where fluid spilled with warm soapy water.  Do not get the drain bandage wet.   Flush the drainage fluid and wash water down the toilet.  Put all used supplies in the trash bag along with your gloves, which you take off last.   Wash your hands  and dry your hands.  Write down the amount, color, and odor of the fluid.  Caregivers will look at this information during your next visit.            ACTIVITY  As tolerated and as directed by your doctor.   Bathe or shower as directed by your doctor.     DIET  Clear liquids until no nausea or vomiting; then light diet for the first day.  Advance to regular diet on second day, unless your doctor orders otherwise.   If nausea and vomiting continues, call your doctor.       MEDICATION INTERACTION:During your procedure you potentially received a medication or medications which may reduce the effectiveness of oral contraceptives. Please consider other forms of contraception for 1 month following your procedure if you are currently using oral contraceptives as your primary form of birth control. In addition to this, we recommend continuing your oral contraceptive as prescribed, unless otherwise instructed by your physician, during this time      [place] IF   Excessive bleeding that does not stop after holding pressure over the area  Temperature of 101 degrees F or above  Excessive redness,  swelling or bruising, and/ or green or yellow, smelly discharge from incision    After general anesthesia or intravenous sedation, for 24 hours or while taking prescription Narcotics:  Limit your activities  A responsible adult needs to be with you for the next 24 hours  Do not drive and operate hazardous machinery  Do not make important personal or business decisions  Do not drink alcoholic beverages  If you have not urinated within 8 hours after discharge, and you are experiencing discomfort from urinary retention, please go to the nearest ED.  If you have sleep apnea and have a CPAP machine, please use it for all naps and sleeping.  Please use caution when taking narcotics and any of your home medications that may cause drowsiness.  *  Please give a list of your current medications to your Primary Care Provider.  *  Please  update this list whenever your medications are discontinued, doses are      changed, or new medications (including over-the-counter products) are added.  *  Please carry medication information at all times in case of emergency situations.

## 2021-07-04 NOTE — OR Nursing (Signed)
Kristain Filo, RN UPDATED SUPPORT PERSON (MOM, TONI) VIA PHONE CALL AT [phone].

## 2021-07-04 NOTE — Anesthesia Post-Procedure Evaluation (Signed)
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    Aldrete Phase I: Aldrete Score: 8    Aldrete Phase II: Aldrete Score: 10      Anesthesia Post Evaluation    Patient location during evaluation: PACU  Patient participation: complete - patient participated  Level of consciousness: awake and alert  Airway patency: patent  Nausea: well controlled.  Complications: no  Cardiovascular status: acceptable.  Respiratory status: acceptable  Hydration status: stable

## 2021-08-08 ENCOUNTER — Ambulatory Visit: Admit: 2021-08-08 | Discharge: 2021-08-08 | Payer: BLUE CROSS/BLUE SHIELD | Attending: Family Medicine

## 2021-08-08 DIAGNOSIS — D259 Leiomyoma of uterus, unspecified: Secondary | ICD-10-CM

## 2021-08-08 MED ORDER — TIZANIDINE HCL 4 MG PO TABS
4 MG | ORAL_TABLET | Freq: Four times a day (QID) | ORAL | 2 refills | Status: AC | PRN
Start: 2021-08-08 — End: 2021-09-07

## 2021-08-08 MED ORDER — MYCOPHENOLATE MOFETIL 250 MG PO CAPS
250 MG | ORAL_CAPSULE | Freq: Two times a day (BID) | ORAL | 0 refills | Status: AC
Start: 2021-08-08 — End: 2021-11-06

## 2021-08-08 MED ORDER — CARBIDOPA-LEVODOPA 10-100 MG PO TABS
10-100 MG | ORAL_TABLET | Freq: Two times a day (BID) | ORAL | 3 refills | Status: DC
Start: 2021-08-08 — End: 2022-04-12

## 2021-08-08 MED ORDER — VITAMIN D (ERGOCALCIFEROL) 1.25 MG (50000 UT) PO CAPS
1.25 MG (50000 UT) | ORAL_CAPSULE | ORAL | 3 refills | Status: AC
Start: 2021-08-08 — End: 2021-11-06

## 2021-08-08 MED ORDER — ONDANSETRON HCL 4 MG PO TABS
4 MG | ORAL_TABLET | Freq: Three times a day (TID) | ORAL | 3 refills | Status: DC | PRN
Start: 2021-08-08 — End: 2023-10-17
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
## 2021-08-08 NOTE — Assessment & Plan Note (Signed)
Some confusion at to whether this was EPS vs Parkinsons. Will need neurology to weigh in

## 2021-08-08 NOTE — Assessment & Plan Note (Signed)
Continue same regimen. Will be seeing PM soon.

## 2021-08-08 NOTE — Assessment & Plan Note (Signed)
hgb has not been checked after surgery. Will attempt to obtain specimen for cbc. Referral placed for heme given hx.

## 2021-08-08 NOTE — Assessment & Plan Note (Signed)
Check stool studies. H/o c. Diff in the past- not surprising given wound hx. Consider GI.

## 2021-08-08 NOTE — Assessment & Plan Note (Signed)
HR came down to 88 during visit. Pt has worn a holter in the past. Will ask cardiology to see her.

## 2021-08-08 NOTE — Assessment & Plan Note (Signed)
Culprit for IDA? Will need to see gyn.

## 2021-08-08 NOTE — Assessment & Plan Note (Signed)
Pt was on a substantial amount of Vit D according to chart/med list. Malabsorption?

## 2021-08-15 ENCOUNTER — Encounter

## 2021-08-15 MED ORDER — TIZANIDINE HCL 4 MG PO TABS
4 MG | ORAL_TABLET | ORAL | 2 refills | Status: AC
Start: 2021-08-15 — End: 2021-11-23

## 2021-08-15 NOTE — Telephone Encounter (Signed)
Last OV 12/14  Next OV 3/14    Pt left a vm requesting muscle relaxer to be resent with directions as 1-2 tab every 8 hours.    Requested Prescriptions     Pending Prescriptions Disp Refills    tiZANidine (ZANAFLEX) 4 MG tablet 180 tablet 2     Sig: Take 1-2 tablets every 8 hours as needed for muscle spasm

## 2021-08-29 NOTE — Telephone Encounter (Signed)
LM for pt to call the office back. Wanted to let pt know that Dr. Judd Lien does not have anything to offer the patient.

## 2021-08-29 NOTE — Telephone Encounter (Signed)
Pt  called in on  her  own  and  states  she  has  had  a shin wound for  2 years and  I can  see where she  has  been  treated  by  Dr  Ashley Murrain.  She says  he  wants  her  seen by  ortho.  Thank you for  checking  her  chart to see if   Dr Mayer Masker can see her

## 2021-08-30 NOTE — Telephone Encounter (Signed)
Is  this  something  that  Dr  Blima Ledger  or Legrand Como can see this  pt  for?  Dr Mayer Masker  feels this  is more  trama.  Shin wound  for  2  years  beign  treated  by  Dr  Lennie Odor.  Pt  says  that dr  says  she needs to  see Ortho.  Plepase  read  previous  notes. There is  no  referral. You will see she is on [place]  schedule  but I need to  move  her from him    Thank  you

## 2021-08-30 NOTE — Telephone Encounter (Signed)
Lvm for patient to call back to discuss

## 2021-08-31 ENCOUNTER — Encounter: Payer: BLUE CROSS/BLUE SHIELD | Attending: Orthopaedic Surgery

## 2021-09-07 ENCOUNTER — Encounter: Payer: BLUE CROSS/BLUE SHIELD | Attending: Orthopaedic Surgery
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
## 2021-09-11 ENCOUNTER — Encounter: Payer: BLUE CROSS/BLUE SHIELD | Attending: Internal Medicine

## 2021-09-11 NOTE — Progress Notes (Deleted)
[facility]: Office Visit New Patient H & P    Reason for visit:  ***    Overview:  ***    History of Present Illness:  Jacqueline Simmons is a     Review of Systems:  14 point ROS was negative except as mentioned above.    ECOG PERFORMANCE STATUS - {ECOGGRABSKA:46124}    Pain - /10. {PAINGRABSKA:46129}    Fatigue - No flowsheet data found.  Distress - No flowsheet data found.         Reviewed and updated this visit by provider:          Current Outpatient Medications   Medication Sig Dispense Refill    tiZANidine (ZANAFLEX) 4 MG tablet Take 1-2 tablets every 8 hours as needed for muscle spasm 180 tablet 2    eszopiclone (LUNESTA) 3 MG TABS Take 3 mg by mouth.      oxyCODONE-acetaminophen (PERCOCET) 5-325 MG per tablet       pregabalin (LYRICA) 200 MG capsule Take 200 mg by mouth 3 times daily.      carbidopa-levodopa (SINEMET) 10-100 MG per tablet Take 1 tablet by mouth 2 times daily 180 tablet 3    mycophenolate (CELLCEPT) 250 MG capsule Take 3 capsules by mouth 2 times daily 540 capsule 0    vitamin D (ERGOCALCIFEROL) 1.25 MG (50000 UT) CAPS capsule Take 1 capsule by mouth once a week 12 capsule 3    ondansetron (ZOFRAN) 4 MG tablet Take 1 tablet by mouth every 8 hours as needed for Nausea or Vomiting 30 tablet 3    ferrous sulfate (IRON 325) 325 (65 Fe) MG tablet Take 325 mg by mouth every other day      folic acid (FOLVITE) 1 MG tablet Take 1 mg by mouth daily      diphenoxylate-atropine (LOMOTIL) 2.5-0.025 MG per tablet Take 1 tablet by mouth 4 times daily as needed for Diarrhea.      sucralfate (CARAFATE) 1 GM tablet Take 1 g by mouth 4 times daily      atovaquone (MEPRON) 750 MG/5ML suspension Take 750 mg by mouth daily      calcium-vitamin D (OSCAL) 250-125 MG-UNIT per tablet Take 1 tablet by mouth daily      Methotrexate, PF, (RASUVO) 25 MG/0.5ML chemo injection pen Inject 25 mg into the skin once a week      acetaminophen (TYLENOL) 500 MG tablet Take 1,000 mg by mouth in the morning and at  bedtime       No current facility-administered medications for this visit.        OBJECTIVE:  There were no vitals taken for this visit.    Physical Exam:  Patient alert and oriented x 3, in no acute distress  Integumentary: No Pallor, no icterus  HEENT: *** mucous membranes, normal oropharynx  Lymph nodes: ***  Cardiovascular:RRR, S1, S2 present, no m/r/g   Lungs: Clear to auscultation, no rales or wheezing, no accessory muscle use  Abdomen: Soft, and non-tender on palpation, no organomegaly, bowel sounds audible  Extremities: No peripheral edema, no joint swelling  Neurological: patient can follow commands and move all extremities    Labs:  Lab Results   Component Value Date    WBC 6.1 05/17/2021    HGB 9.9 (L) 07/02/2021    HCT 34.0 (L) 05/17/2021    MCV 96.0 05/17/2021    PLT 364 05/17/2021       Lab Results   Component Value Date  LYMPHOPCT 19 05/17/2021    LYMPHSABS 1.2 05/17/2021    MONOPCT 7 05/17/2021    MONOSABS 0.4 05/17/2021    EOSABS 0.1 05/17/2021    BASOPCT 1 05/17/2021       Lab Results   Component Value Date    NA 137 05/17/2021    K 4.1 05/17/2021    CL 107 05/17/2021    CO2 25 05/17/2021    BUN 10 05/17/2021    CREATININE 0.90 05/17/2021    GLUCOSE 89 05/17/2021    CALCIUM 9.4 05/17/2021    PROT 7.6 05/17/2021    LABALBU 3.5 05/17/2021    BILITOT 0.2 05/17/2021    ALKPHOS 75 05/17/2021    AST 12 (L) 05/17/2021    ALT 22 05/17/2021    LABGLOM >60 05/17/2021    GFRAA >60 05/17/2021    GLOB 4.1 (H) 05/17/2021       Imaging:  No results found.    Problems:  No diagnosis found.    ***          PLAN:  ***        Howie Ill, MD  [facility] [place] [address]  Office : [phone]  Fax : [phone]    Elements of this note have been dictated using speech recognition software. As a result, errors of speech recognition may have occurred.

## 2021-09-13 ENCOUNTER — Encounter: Payer: BLUE CROSS/BLUE SHIELD | Attending: Obstetrics & Gynecology

## 2021-09-24 ENCOUNTER — Telehealth

## 2021-09-24 NOTE — Telephone Encounter (Signed)
IR said they will only need [place] to order the IR port Placement and they will contact Rakiyah to schedule.    I have pended the order.    Desma Paganini, [place]

## 2021-09-24 NOTE — Telephone Encounter (Signed)
Please call IR and see if they can do this or do we need to send her to vascular.

## 2021-09-24 NOTE — Telephone Encounter (Signed)
Would this go to Interventional Radiology?    Her next appt with you is 3/14    Desma Paganini, [place]

## 2021-09-24 NOTE — Telephone Encounter (Signed)
-----   Message from [place] sent at 09/23/2021  9:33 AM EST -----  Regarding: [name]   Good afternoon!    I saw Dr. Rene Paci in December and we had discussed getting a port a cath back in. We agreed that it would be better to get the port a cath while I was already under. Unfortunately, I will not be having that surgery any time soon. Can Dr. Shrouded send me a referral to get a port  placed     Thanks!    Jacqueline Simmons

## 2021-09-25 NOTE — Telephone Encounter (Signed)
The CVC is not the same as a port-a-cath. I think this will require surgery, not IR, but please confirm with IR.

## 2021-09-25 NOTE — Telephone Encounter (Signed)
From: Shatana Creed  To: Dr. Lyman Speller  Sent: 09/25/2021 3:00 PM EST  Subject: Port Placement    Good afternoon!    I received a call from [facility] for a CVC placement, not a port a cath. Is this correct? Thanks for all of your help!

## 2021-09-26 ENCOUNTER — Telehealth

## 2021-09-26 NOTE — Telephone Encounter (Signed)
I spoke with the pre-assessment nurse, Leroy Sea, who reassured me that her CVC placement is indeed a port a cath.     He asked that we draw some labs prior to her appointment with them on 2/7.     Desma Paganini, [place]

## 2021-09-26 NOTE — Telephone Encounter (Signed)
Pt can have labs drawn here and we will provide to IR. Do they need a surgery clearance as well?

## 2021-09-28 ENCOUNTER — Encounter: Payer: BLUE CROSS/BLUE SHIELD | Attending: Cardiovascular Disease

## 2021-10-01 NOTE — Telephone Encounter (Signed)
Pt sent a MyChart message regarding this, and I sent her a reply asking her to call the office to schedule a lab appointment this afternoon.    Desma Paganini, [place]

## 2021-10-01 NOTE — Telephone Encounter (Signed)
-----   Message from Verl Dicker sent at 10/01/2021 10:32 AM EST -----  Subject: Referral Request    Reason for referral request? patient is requesting standard blood work.   please call mom toni with information  Provider patient wants to be referred to(if known): Lyman Speller III    Provider Phone Number(if known):    Additional Information for Provider?   ---------------------------------------------------------------------------  --------------  Stanley    [phone]; OK to leave message on voicemail  ---------------------------------------------------------------------------  --------------

## 2021-10-02 ENCOUNTER — Inpatient Hospital Stay: Admit: 2021-10-02 | Payer: BLUE CROSS/BLUE SHIELD | Attending: Certified Registered"

## 2021-10-02 ENCOUNTER — Encounter

## 2021-10-02 DIAGNOSIS — Z87898 Personal history of other specified conditions: Secondary | ICD-10-CM

## 2021-10-02 DIAGNOSIS — M329 Systemic lupus erythematosus, unspecified: Secondary | ICD-10-CM

## 2021-10-02 LAB — CBC WITH AUTO DIFFERENTIAL
Absolute Eos #: 0.1 10*3/uL (ref 0.0–0.8)
Absolute Immature Granulocyte: 0 10*3/uL (ref 0.0–0.5)
Absolute Lymph #: 1.3 10*3/uL (ref 0.5–4.6)
Absolute Mono #: 0.3 10*3/uL (ref 0.1–1.3)
Basophils Absolute: 0.1 10*3/uL (ref 0.0–0.2)
Basophils: 1 % (ref 0.0–2.0)
Eosinophils %: 1 % (ref 0.5–7.8)
Hematocrit: 33.8 % — ABNORMAL LOW (ref 35.8–46.3)
Hemoglobin: 9.3 g/dL — ABNORMAL LOW (ref 11.7–15.4)
Immature Granulocytes: 0 % (ref 0.0–5.0)
Lymphocytes: 27 % (ref 13–44)
MCH: 20.4 PG — ABNORMAL LOW (ref 26.1–32.9)
MCHC: 27.5 g/dL — ABNORMAL LOW (ref 31.4–35.0)
MCV: 74.3 FL — ABNORMAL LOW (ref 82–102)
MPV: 11.4 FL (ref 9.4–12.3)
Monocytes: 7 % (ref 4.0–12.0)
Platelets: 390 10*3/uL (ref 150–450)
RBC: 4.55 M/uL (ref 4.05–5.2)
RDW: 19.4 % — ABNORMAL HIGH (ref 11.9–14.6)
Seg Neutrophils: 63 % (ref 43–78)
Segs Absolute: 3.1 10*3/uL (ref 1.7–8.2)
WBC: 4.9 10*3/uL (ref 4.3–11.1)
nRBC: 0 10*3/uL (ref 0.0–0.2)

## 2021-10-02 LAB — URINALYSIS
Bilirubin Urine: NEGATIVE
Glucose, UA: NEGATIVE mg/dL
Ketones, Urine: NEGATIVE mg/dL
Leukocyte Esterase, Urine: NEGATIVE
Nitrite, Urine: NEGATIVE
Protein, UA: NEGATIVE mg/dL
Specific Gravity, UA: 1.007 (ref 1.001–1.023)
Urobilinogen, Urine: 0.2 EU/dL (ref 0.2–1.0)
pH, Urine: 7 (ref 5.0–9.0)

## 2021-10-02 LAB — COMPREHENSIVE METABOLIC PANEL
ALT: 31 U/L (ref 12–65)
AST: 16 U/L (ref 15–37)
Albumin/Globulin Ratio: 1 (ref 0.4–1.6)
Albumin: 4.1 g/dL (ref 3.5–5.0)
Alk Phosphatase: 78 U/L (ref 50–136)
Anion Gap: 8 mmol/L (ref 2–11)
BUN: 13 MG/DL (ref 6–23)
CO2: 24 mmol/L (ref 21–32)
Calcium: 9.4 MG/DL (ref 8.3–10.4)
Chloride: 106 mmol/L (ref 101–110)
Creatinine: 1.1 MG/DL — ABNORMAL HIGH (ref 0.6–1.0)
Est, Glom Filt Rate: >60 mL/min/{1.73_m2} (ref >60)
Globulin: 4.3 g/dL (ref 2.8–4.5)
Glucose: 88 mg/dL (ref 65–100)
Potassium: 4.5 mmol/L (ref 3.5–5.1)
Sodium: 138 mmol/L (ref 133–143)
Total Bilirubin: 0.2 MG/DL (ref 0.2–1.1)
Total Protein: 8.4 g/dL — ABNORMAL HIGH (ref 6.3–8.2)

## 2021-10-02 LAB — PROTIME-INR
INR: 0.9
Protime: 12.9 s (ref 12.6–14.3)

## 2021-10-02 LAB — APTT: PTT: 31.4 s (ref 24.5–34.2)

## 2021-10-02 MED ORDER — PROPOFOL 200 MG/20ML IV EMUL
200 MG/20ML | INTRAVENOUS | Status: AC
Start: 2021-10-02 — End: ?

## 2021-10-02 MED ORDER — LIDOCAINE HCL (PF) 2 % IJ SOLN
2 % | INTRAMUSCULAR | Status: AC
Start: 2021-10-02 — End: ?

## 2021-10-02 MED ORDER — PROPOFOL 200 MG/20ML IV EMUL
20020 MG/20ML | INTRAVENOUS | Status: DC | PRN
Start: 2021-10-02 — End: 2021-10-02
  Administered 2021-10-02: 14:00:00 70 via INTRAVENOUS
  Administered 2021-10-02: 14:00:00 200 via INTRAVENOUS

## 2021-10-02 MED ORDER — LIDOCAINE-EPINEPHRINE 2 %-1:100000 IJ SOLN
Freq: Once | INTRAMUSCULAR | Status: AC | PRN
Start: 2021-10-02 — End: 2021-10-02
  Administered 2021-10-02: 15:00:00 7 via INTRADERMAL

## 2021-10-02 MED ORDER — HEPARIN SOD (PORK) LOCK FLUSH 100 UNIT/ML IV SOLN
100 UNIT/ML | Freq: Once | INTRAVENOUS | Status: AC | PRN
Start: 2021-10-02 — End: 2021-10-02
  Administered 2021-10-02: 15:00:00 500

## 2021-10-02 MED ORDER — HEPARIN SOD (PORK) LOCK FLUSH 100 UNIT/ML IV SOLN
100 UNIT/ML | INTRAVENOUS | Status: AC
Start: 2021-10-02 — End: ?

## 2021-10-02 MED ORDER — LIDOCAINE-EPINEPHRINE 2 %-1:100000 IJ SOLN
Freq: Once | INTRAMUSCULAR | Status: AC | PRN
Start: 2021-10-02 — End: 2021-10-02
  Administered 2021-10-02: 14:00:00 4 via INTRADERMAL

## 2021-10-02 MED ORDER — CEFAZOLIN 2000 MG IN 20 ML SWFI IV SYRINGE (PREMIX)
Status: DC | PRN
Start: 2021-10-02 — End: 2021-10-02
  Administered 2021-10-02: 14:00:00 2 via INTRAVENOUS

## 2021-10-02 MED ORDER — LIDOCAINE-EPINEPHRINE 2 %-1:100000 IJ SOLN
2-1100000 percent-1:100000 | INTRAMUSCULAR | Status: AC
Start: 2021-10-02 — End: ?

## 2021-10-02 MED ORDER — LIDOCAINE HCL (PF) 2 % IJ SOLN
2 % | INTRAMUSCULAR | Status: DC | PRN
Start: 2021-10-02 — End: 2021-10-02
  Administered 2021-10-02: 14:00:00 100 via INTRAVENOUS

## 2021-10-02 MED ORDER — CEFAZOLIN 2000 MG IN 20 ML SWFI IV SYRINGE (PREMIX)
Status: AC
Start: 2021-10-02 — End: ?

## 2021-10-02 MED ORDER — LACTATED RINGERS IV SOLN
INTRAVENOUS | Status: DC | PRN
Start: 2021-10-02 — End: 2021-10-02
  Administered 2021-10-02: 14:00:00 via INTRAVENOUS

## 2021-10-02 MED FILL — DIPRIVAN 200 MG/20ML IV EMUL: 200 MG/20ML | INTRAVENOUS | Qty: 20

## 2021-10-02 MED FILL — XYLOCAINE/EPINEPHRINE 2 %-1:100000 IJ SOLN: 2 %-1:100000 | INTRAMUSCULAR | Qty: 50

## 2021-10-02 MED FILL — CEFAZOLIN 2000 MG IN 20 ML SWFI IV SYRINGE (PREMIX): Qty: 2000

## 2021-10-02 MED FILL — HEPARIN SOD (PORK) LOCK FLUSH 100 UNIT/ML IV SOLN: 100 UNIT/ML | INTRAVENOUS | Qty: 5

## 2021-10-02 MED FILL — LIDOCAINE HCL (PF) 2 % IJ SOLN: 2 % | INTRAMUSCULAR | Qty: 5

## 2021-10-02 NOTE — Anesthesia Post-Procedure Evaluation (Signed)
Department of Anesthesiology  Postprocedure Note    Patient: Jacqueline Simmons  MRN: [id number]  Birthdate: 1980/12/24  Date of evaluation: 10/02/2021      Procedure Summary     Date: 10/02/21 Room / Location: [facility]    Anesthesia Start: 0914 Anesthesia Stop: 7591    Procedure: IR PORT PLACEMENT EQUAL OR GREATER THAN 5 YEARS Diagnosis: History of difficult venous access    Scheduled Providers: Rebekah Chesterfield, APRN - CRNA; Marcene Brawn, MD Responsible Provider: Marcene Brawn, MD    Anesthesia Type: TIVA ASA Status: 3          Anesthesia Type: No value filed.    Aldrete Phase I: Aldrete Score: 10    Aldrete Phase II:        Anesthesia Post Evaluation    Patient location during evaluation: PACU  Patient participation: complete - patient participated  Level of consciousness: awake and awake and alert  Airway patency: patent  Nausea & Vomiting: no nausea  Complications: no  Cardiovascular status: hemodynamically stable  Respiratory status: acceptable  Hydration status: euvolemic  Multimodal analgesia pain management approach

## 2021-10-02 NOTE — OR Nursing (Signed)
Prep completed. Port information reviewed with patient and her mother. Patient ready for procedure.
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
## 2021-10-02 NOTE — OR Nursing (Signed)
Patient and family member educated on discharge instructions and power port using BARD supplied material. Opportunity for questions provided. Both verbalized understanding. Recovery period without difficulty. Pt alert and oriented and denies pain. Dressing is clean, dry, and intact.  Pt escorted to lobby discharge area via wheelchair. Vital signs and Aldrete score completed.

## 2021-10-02 NOTE — OR Nursing (Signed)
Patient in IR Biplane for procedure.  Anesthesia has assumed care of patient for the duration of procedure.  Please see anesthesia record for documentation.    Patient transported to Biplane via stretcher.      Patient moved to the procedure table without assistance.      Patient secured to the procedure table.    Pt connected to EKG, SPO2, BP and ETCO2     A Pre-assessment was conducted and a set of vital signs were completed.  The provider was notified of any changes from the assessment and/or vital signs immediately prior to beginning moderate sedation.
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
## 2021-10-02 NOTE — Brief Op Note (Signed)
Department of Interventional Radiology  [phone]        Interventional Radiology Brief Procedure Note    Patient: Jacqueline Simmons MRN: [id number]  SSN: [id number]    Date of Birth: March 27, 1981  Age: 41 y.o.  Sex: female      Date of Procedure: 10/02/2021    Pre-Procedure Diagnosis: lupus, IDA, poor PIV access    Post-Procedure Diagnosis: SAME    Procedure(s): Venous Chest Port Placement    Brief Description of Procedure: as above    Performed By: Oval Linsey, PA-C     Assistants: None    Anesthesia:TIVS/MAC    Estimated Blood Loss: Less than 59m    Specimens:  None    Implants:  Subcutaneous Port    Findings: catheter tip in right atrium    Complications: None    Recommendations: ok to use port     Follow Up: prn    Signed By: KOval Linsey PA-C     October 02, 2021

## 2021-10-02 NOTE — OR Nursing (Signed)
Patient cleared from anesthesia's service per Dr Russella Dar. Patient free to leave at this time.

## 2021-10-02 NOTE — Discharge Instructions (Signed)
If you have any questions about your procedure, please call the Interventional Radiology department at [phone] or [phone].   After business hours (5pm) and weekends, call the answering service at [phone] and ask for the Radiologist on call to be paged.

## 2021-10-18 ENCOUNTER — Encounter: Payer: BLUE CROSS/BLUE SHIELD | Attending: Obstetrics & Gynecology

## 2021-10-24 ENCOUNTER — Encounter: Admit: 2021-10-24 | Discharge: 2021-10-24 | Payer: PRIVATE HEALTH INSURANCE | Attending: Internal Medicine

## 2021-10-24 ENCOUNTER — Inpatient Hospital Stay: Admit: 2021-10-24 | Payer: BLUE CROSS/BLUE SHIELD

## 2021-10-24 DIAGNOSIS — D649 Anemia, unspecified: Secondary | ICD-10-CM

## 2021-10-24 LAB — COMPREHENSIVE METABOLIC PANEL
ALT: 19 U/L (ref 12–65)
AST: 18 U/L (ref 15–37)
Albumin/Globulin Ratio: 1.1 (ref 0.4–1.6)
Albumin: 4.1 g/dL (ref 3.5–5.0)
Alk Phosphatase: 76 U/L (ref 50–136)
Anion Gap: 6 mmol/L (ref 2–11)
BUN: 11 MG/DL (ref 6–23)
CO2: 23 mmol/L (ref 21–32)
Calcium: 9.4 MG/DL (ref 8.3–10.4)
Chloride: 111 mmol/L — ABNORMAL HIGH (ref 101–110)
Creatinine: 0.8 MG/DL (ref 0.6–1.0)
Est, Glom Filt Rate: >60 mL/min/{1.73_m2} (ref >60)
Globulin: 3.6 g/dL (ref 2.8–4.5)
Glucose: 93 mg/dL (ref 65–100)
Potassium: 4.6 mmol/L (ref 3.5–5.1)
Sodium: 140 mmol/L (ref 133–143)
Total Bilirubin: 0.2 MG/DL (ref 0.2–1.1)
Total Protein: 7.7 g/dL (ref 6.3–8.2)

## 2021-10-24 LAB — TSH: TSH, 3RD GENERATION: 0.206 u[IU]/mL — ABNORMAL LOW (ref 0.358–3)

## 2021-10-24 LAB — FERRITIN: Ferritin: 6 NG/ML — ABNORMAL LOW (ref 8–388)

## 2021-10-24 LAB — T4, FREE: T4 Free: 1.2 NG/DL (ref 0.78–1.4)

## 2021-10-24 LAB — TRANSFERRIN SATURATION
Iron: 29 ug/dL — ABNORMAL LOW (ref 35–150)
TIBC: 403 ug/dL (ref 250–450)
TRANSFERRIN SATURATION: 7 % — ABNORMAL LOW (ref >20)

## 2021-10-24 LAB — VITAMIN B12: Vitamin B-12: 459 pg/mL (ref 193–986)

## 2021-10-24 LAB — FOLATE: Folate: 16.7 ng/mL (ref 3.1–17.5)

## 2021-10-24 NOTE — Patient Instructions (Signed)
Patient Instructions from Today's Visit    Reason for Visit:  New Patient - IDA     Diagnosis Information:  https://www.cancer.net/about-us/asco-answers-patient-education-materials/asco-answers-fact-sheets    Plan:  We are seeing you today for your anemia.  We are going to do some additional lab work today to further investigate this.  If IV iron is indicated, we will let you know and schedule infusion appointments at that time.  Continue oral iron.  It is recommended that you see a gynecologist as soon as possible.   Please call us if you have any questions or concerns before your next visit.     Follow Up:  Follow up 4 weeks after last IV iron infusion with Dr. Deatra Canter.     Recent Lab Results:  No visits with results within 3 Day(s) from this visit.   Latest known visit with results is:   Orders Only on 10/02/2021   Component Date Value Ref Range Status    Color, UA 10/02/2021 YELLOW/STRAW    Final    Color Reference Range: Straw, Yellow or Dark Yellow    Appearance 10/02/2021 CLEAR    Final    Specific Gravity, UA 10/02/2021 1.007  1.001 - 1.023   Final    pH, Urine 10/02/2021 7.0  5.0 - 9.0   Final    Protein, UA 10/02/2021 Negative  Negative mg/dL Final    Glucose, UA 10/02/2021 Negative  mg/dL Final    Ketones, Urine 10/02/2021 Negative  Negative mg/dL Final    Bilirubin Urine 10/02/2021 Negative  Negative   Final    Blood, Urine 10/02/2021 SMALL (A)  Negative   Final    Urobilinogen, Urine 10/02/2021 0.2  0.2 - 1.0 EU/dL Final    Nitrite, Urine 10/02/2021 Negative  Negative   Final    Leukocyte Esterase, Urine 10/02/2021 Negative  Negative   Final    WBC, UA 10/02/2021 0-4  0 - 4 /hpf Final    RBC, UA 10/02/2021 0-5  0 - 5 /hpf Final    Epithelial Cells UA 10/02/2021 0-5  0 - 5 /hpf Final    BACTERIA, URINE 10/02/2021 1+ (A)  Negative /hpf Final    Casts 10/02/2021 0-2  0 - 2 /lpf Final    PTT 10/02/2021 31.4  24.5 - 34.2 SEC Final    Comment: Heparin Therapeutic Range = 74 - 123 seconds  When clinically  evaluating a  prolonged PTT, preanalytic variables  including possible fluctuations in  levels of heparin should be considered.      Protime 10/02/2021 12.9  12.6 - 14.3 sec Final    INR 10/02/2021 0.9    Final    Comment: Suggested therapeutic INR range:  Venous thrombosis and embolus  2.0-3.0  Prosthetic heart valve         2.5-3.5  ** Note new reference range and method **      WBC 10/02/2021 4.9  4.3 - 11.1 K/uL Final    RBC 10/02/2021 4.55  4.05 - 5.2 M/uL Final    Hemoglobin 10/02/2021 9.3 (A)  11.7 - 15.4 g/dL Final    Hematocrit 10/02/2021 33.8 (A)  35.8 - 46.3 % Final    MCV 10/02/2021 74.3 (A)  82 - 102 FL Final    MCH 10/02/2021 20.4 (A)  26.1 - 32.9 PG Final    MCHC 10/02/2021 27.5 (A)  31.4 - 35.0 g/dL Final    RDW 10/02/2021 19.4 (A)  11.9 - 14.6 % Final    Platelets 10/02/2021 390  150 - 450 K/uL Final    MPV 10/02/2021 11.4  9.4 - 12.3 FL Final    nRBC 10/02/2021 0.00  0.0 - 0.2 K/uL Final    **Note: Absolute NRBC parameter is now reported with Hemogram**    Differential Type 10/02/2021 AUTOMATED    Final    Seg Neutrophils 10/02/2021 63  43 - 78 % Final    Lymphocytes 10/02/2021 27  13 - 44 % Final    Monocytes 10/02/2021 7  4.0 - 12.0 % Final    Eosinophils % 10/02/2021 1  0.5 - 7.8 % Final    Basophils 10/02/2021 1  0.0 - 2.0 % Final    Immature Granulocytes 10/02/2021 0  0.0 - 5.0 % Final    Segs Absolute 10/02/2021 3.1  1.7 - 8.2 K/UL Final    Absolute Lymph # 10/02/2021 1.3  0.5 - 4.6 K/UL Final    Absolute Mono # 10/02/2021 0.3  0.1 - 1.3 K/UL Final    Absolute Eos # 10/02/2021 0.1  0.0 - 0.8 K/UL Final    Basophils Absolute 10/02/2021 0.1  0.0 - 0.2 K/UL Final    Absolute Immature Granulocyte 10/02/2021 0.0  0.0 - 0.5 K/UL Final    Sodium 10/02/2021 138  133 - 143 mmol/L Final    Potassium 10/02/2021 4.5  3.5 - 5.1 mmol/L Final    Chloride 10/02/2021 106  101 - 110 mmol/L Final    CO2 10/02/2021 24  21 - 32 mmol/L Final    Anion Gap 10/02/2021 8  2 - 11 mmol/L Final    Glucose 10/02/2021 88   65 - 100 mg/dL Final    BUN 10/02/2021 13  6 - 23 MG/DL Final    Creatinine 10/02/2021 1.10 (A)  0.6 - 1.0 MG/DL Final    Est, Glom Filt Rate 10/02/2021 >60  >60 ml/min/1.56m Final    Comment:   Pediatric calculator link: https://www.kidney.org/professionals/kdoqi/gfr_calculatorped    These results are not intended for use in patients <140years of age.    eGFR results are calculated without a race factor using  the 2021 CKD-EPI equation. Careful clinical correlation is recommended, particularly when comparing to results calculated using previous equations.  The CKD-EPI equation is less accurate in patients with extremes of muscle mass, extra-renal metabolism of creatinine, excessive creatine ingestion, or following therapy that affects renal tubular secretion.      Calcium 10/02/2021 9.4  8.3 - 10.4 MG/DL Final    Total Bilirubin 10/02/2021 0.2  0.2 - 1.1 MG/DL Final    ALT 10/02/2021 31  12 - 65 U/L Final    AST 10/02/2021 16  15 - 37 U/L Final    Alk Phosphatase 10/02/2021 78  50 - 136 U/L Final    Total Protein 10/02/2021 8.4 (A)  6.3 - 8.2 g/dL Final    Albumin 10/02/2021 4.1  3.5 - 5.0 g/dL Final    Globulin 10/02/2021 4.3  2.8 - 4.5 g/dL Final    Albumin/Globulin Ratio 10/02/2021 1.0  0.4 - 1.6   Final         Treatment Summary has been discussed and given to patient: N/A        -------------------------------------------------------------------------------------------------------------------  Please call our office at [phone] you have any  of the following symptoms:   Fever of 100.5 or greater  Chills  Shortness of breath  Swelling or pain in one leg    After office hours an answering service is available and will contact a  provider for emergencies or if you are experiencing any of the above symptoms.    Patient does express an interest in My Chart.  My Chart log in information explained on the after visit summary printout at the [place] desk.    Welford Roche, RN
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
## 2021-10-25 ENCOUNTER — Other Ambulatory Visit: Payer: BLUE CROSS/BLUE SHIELD

## 2021-10-25 ENCOUNTER — Encounter

## 2021-10-25 LAB — SOLUBLE TRANSFERRIN RECEPTOR: Soluble Transferrin Recept: 43 nmol/L — ABNORMAL HIGH (ref 12.2–27.3)

## 2021-10-26 ENCOUNTER — Inpatient Hospital Stay: Payer: BLUE CROSS/BLUE SHIELD

## 2021-10-26 ENCOUNTER — Inpatient Hospital Stay: Admit: 2021-10-26 | Payer: BLUE CROSS/BLUE SHIELD

## 2021-10-26 ENCOUNTER — Encounter

## 2021-10-26 DIAGNOSIS — D649 Anemia, unspecified: Secondary | ICD-10-CM

## 2021-10-26 LAB — CBC WITH AUTO DIFFERENTIAL
Absolute Eos #: 0.1 10*3/uL (ref 0.0–0.8)
Absolute Immature Granulocyte: 0 10*3/uL (ref 0.0–0.5)
Absolute Lymph #: 1.4 10*3/uL (ref 0.5–4.6)
Absolute Mono #: 0.4 10*3/uL (ref 0.1–1.3)
Basophils Absolute: 0.1 10*3/uL (ref 0.0–0.2)
Basophils: 1 % (ref 0.0–2.0)
Eosinophils %: 2 % (ref 0.5–7.8)
Hematocrit: 31.1 % — ABNORMAL LOW (ref 35.8–46.3)
Hemoglobin: 8.9 g/dL — ABNORMAL LOW (ref 11.7–15.4)
Immature Granulocytes: 0 % (ref 0.0–5.0)
Lymphocytes: 17 % (ref 13–44)
MCH: 21.3 PG — ABNORMAL LOW (ref 26.1–32.9)
MCHC: 28.6 g/dL — ABNORMAL LOW (ref 31.4–35.0)
MCV: 74.6 FL — ABNORMAL LOW (ref 82.0–102.0)
MPV: 10 FL (ref 9.4–12.3)
Monocytes: 5 % (ref 4.0–12.0)
Platelets: 430 10*3/uL (ref 150–450)
RBC: 4.17 M/uL (ref 4.05–5.2)
RDW: 22.4 % — ABNORMAL HIGH (ref 11.9–14.6)
Seg Neutrophils: 76 % (ref 43–78)
Segs Absolute: 6.4 10*3/uL (ref 1.7–8.2)
WBC: 8.4 10*3/uL (ref 4.3–11.1)
nRBC: 0 10*3/uL (ref 0.0–0.2)

## 2021-10-26 LAB — RETICULOCYTES
Absolute Retic #: 0.0655 M/ul (ref 0.026–0.095)
Immature Retic Fraction: 33.4 % — ABNORMAL HIGH (ref 3.0–15.9)
Retic Hemoglobin conc.: 24 pg — ABNORMAL LOW (ref 29–35)
Reticulocyte Count,Automated: 1.6 % (ref 0.3–2.0)

## 2021-10-26 MED ORDER — NORMAL SALINE FLUSH 0.9 % IV SOLN
0.9 % | INTRAVENOUS | Status: DC | PRN
Start: 2021-10-26 — End: 2021-10-27
  Administered 2021-10-26: 19:00:00 10 mL via INTRAVENOUS

## 2021-10-26 NOTE — Progress Notes (Signed)
Patient arrived to port lab for port access and lab draw   Port accessed and labs drawn per protocol   *Port flushed and de-accessed  Patient discharged from port lab ambulatory*

## 2021-10-29 LAB — HOMOCYSTEINE, PLASMA: Homocysteine: 9.3 umol/L (ref 0.0–14.5)

## 2021-10-29 LAB — METHYLMALONIC ACID, SERUM: Methylmalonic Acid: 177 nmol/L (ref 0–378)

## 2021-11-01 ENCOUNTER — Ambulatory Visit: Payer: BLUE CROSS/BLUE SHIELD

## 2021-11-01 DIAGNOSIS — D509 Iron deficiency anemia, unspecified: Secondary | ICD-10-CM

## 2021-11-01 MED ORDER — FERUMOXYTOL 510 MG/17ML IV SOLN
510 MG/17ML | Freq: Once | INTRAVENOUS | Status: AC
Start: 2021-11-01 — End: 2021-11-01
  Administered 2021-11-01: 20:00:00 510 mg via INTRAVENOUS

## 2021-11-01 MED ORDER — NORMAL SALINE FLUSH 0.9 % IV SOLN
0.9 % | INTRAVENOUS | Status: DC | PRN
Start: 2021-11-01 — End: 2021-11-02
  Administered 2021-11-01 (×2): 10 mL via INTRAVENOUS

## 2021-11-01 MED ORDER — SODIUM CHLORIDE 0.9 % IV SOLN
0.9 % | INTRAVENOUS | Status: DC | PRN
Start: 2021-11-01 — End: 2021-11-02
  Administered 2021-11-01: 20:00:00 25 mL/h via INTRAVENOUS

## 2021-11-01 MED FILL — FERUMOXYTOL 510 MG/17ML IV SOLN: 510 MG/17ML | INTRAVENOUS | Qty: 17

## 2021-11-01 NOTE — Progress Notes (Signed)
Arrived to the [facility].  Feraheme completed. Patient tolerated well.   Any issues or concerns during appointment: none.  Next iron appt on 11/08/21 at 230  Patient instructed to call provider with temperature of 100.4 or greater or nausea/vomiting/ diarrhea or pain not controlled by medications  Discharge ambulatory with family.

## 2021-11-06 ENCOUNTER — Encounter: Payer: BLUE CROSS/BLUE SHIELD | Attending: Family Medicine

## 2021-11-08 ENCOUNTER — Ambulatory Visit: Payer: BLUE CROSS/BLUE SHIELD

## 2021-11-13 ENCOUNTER — Inpatient Hospital Stay: Admit: 2021-11-13 | Payer: PRIVATE HEALTH INSURANCE

## 2021-11-13 DIAGNOSIS — D509 Iron deficiency anemia, unspecified: Secondary | ICD-10-CM

## 2021-11-13 MED ORDER — SODIUM CHLORIDE 0.9 % IV SOLN
0.9 % | INTRAVENOUS | Status: DC | PRN
Start: 2021-11-13 — End: 2021-11-14
  Administered 2021-11-13: 20:00:00 25 mL/h via INTRAVENOUS

## 2021-11-13 MED ORDER — SODIUM CHLORIDE 0.9 % IV SOLN
0.9 % | Freq: Once | INTRAVENOUS | Status: AC
Start: 2021-11-13 — End: 2021-11-13
  Administered 2021-11-13: 20:00:00 510 mg via INTRAVENOUS

## 2021-11-13 MED ORDER — NORMAL SALINE FLUSH 0.9 % IV SOLN
0.9 % | INTRAVENOUS | Status: DC | PRN
Start: 2021-11-13 — End: 2021-11-14
  Administered 2021-11-13: 20:00:00 10 mL via INTRAVENOUS

## 2021-11-13 MED FILL — FERUMOXYTOL 510 MG/17ML IV SOLN: 510 MG/17ML | INTRAVENOUS | Qty: 17

## 2021-11-13 NOTE — Progress Notes (Signed)
Arrived to the [facility]. Feraheme completed. Patient tolerated well.   Any issues or concerns during appointment: none.  Patient aware of next lab and [facility] office visit on 01/23/22 (date) at 1500 (time).  Patient instructed to call provider with temperature of 100.4 or greater or nausea/vomiting/ diarrhea or pain not controlled by medications  Discharged ambulatory.

## 2021-11-23 ENCOUNTER — Encounter

## 2021-11-23 MED ORDER — TIZANIDINE HCL 4 MG PO TABS
4 MG | ORAL_TABLET | ORAL | 2 refills | Status: AC
Start: 2021-11-23 — End: 2022-08-06

## 2021-11-28 ENCOUNTER — Encounter: Admit: 2021-11-28 | Discharge: 2021-11-28 | Payer: BLUE CROSS/BLUE SHIELD | Primary: Family Medicine

## 2021-11-28 ENCOUNTER — Ambulatory Visit: Admit: 2021-11-28 | Discharge: 2021-11-28 | Payer: BLUE CROSS/BLUE SHIELD | Attending: Obstetrics & Gynecology

## 2021-11-28 DIAGNOSIS — D259 Leiomyoma of uterus, unspecified: Secondary | ICD-10-CM

## 2021-11-28 MED ORDER — NORETHINDRONE 0.35 MG PO TABS
0.35 MG | ORAL_TABLET | Freq: Every day | ORAL | 12 refills | Status: DC
Start: 2021-11-28 — End: 2022-04-12

## 2021-11-28 NOTE — Addendum Note (Signed)
Addended by: Celso Amy E on: 11/28/2021 12:25 PM     Modules accepted: Orders
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
## 2021-12-05 LAB — PAP IG, APTIMA HPV AND RFX 16/18,45 (199305): HPV Aptima: NEGATIVE

## 2022-01-15 ENCOUNTER — Encounter: Payer: BLUE CROSS/BLUE SHIELD | Attending: Family Medicine | Primary: Family Medicine

## 2022-01-15 NOTE — Telephone Encounter (Signed)
Jacqueline Simmons- Pt has no showed 2 X, Dr Phylis Bougie requests a letter be sent.    Thank You

## 2022-01-17 ENCOUNTER — Encounter

## 2022-01-23 ENCOUNTER — Encounter: Payer: BLUE CROSS/BLUE SHIELD | Primary: Family Medicine

## 2022-01-23 ENCOUNTER — Encounter: Payer: BLUE CROSS/BLUE SHIELD | Attending: Internal Medicine
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
## 2022-01-30 ENCOUNTER — Encounter: Payer: BLUE CROSS/BLUE SHIELD | Attending: Neurology | Primary: Family Medicine
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
## 2022-02-05 ENCOUNTER — Encounter

## 2022-02-13 ENCOUNTER — Encounter

## 2022-02-15 ENCOUNTER — Encounter

## 2022-02-28 ENCOUNTER — Encounter: Payer: BLUE CROSS/BLUE SHIELD | Attending: Obstetrics & Gynecology | Primary: Family Medicine

## 2022-03-06 ENCOUNTER — Encounter: Payer: BLUE CROSS/BLUE SHIELD | Attending: Family Medicine | Primary: Family Medicine

## 2022-04-12 ENCOUNTER — Ambulatory Visit: Admit: 2022-04-12 | Discharge: 2022-04-12 | Attending: Family Medicine | Primary: Family Medicine

## 2022-04-12 DIAGNOSIS — Z7689 Persons encountering health services in other specified circumstances: Secondary | ICD-10-CM

## 2022-04-12 MED ORDER — FERROUS SULFATE 300 (60 FE) MG/5ML PO SYRP
300 (60 Fe) MG/5ML | Freq: Every day | ORAL | 5 refills | Status: AC
Start: 2022-04-12 — End: 2022-09-12

## 2022-04-12 MED ORDER — PROMETHAZINE HCL 6.25 MG/5ML PO SYRP
6.255 MG/5ML | ORAL | 5 refills | Status: AC | PRN
Start: 2022-04-12 — End: 2023-10-15

## 2022-04-12 MED ORDER — FUROSEMIDE 10 MG/ML PO SOLN
10 MG/ML | ORAL | 5 refills | Status: DC
Start: 2022-04-12 — End: 2023-10-17

## 2022-04-12 MED ORDER — FOLIC ACID 0.2 MG/ML PO SOLN
Freq: Every day | 5 refills | Status: DC
Start: 2022-04-12 — End: 2024-06-06

## 2022-04-12 MED ORDER — ATOVAQUONE 750 MG/5ML PO SUSP
7505 MG/5ML | Freq: Every day | ORAL | 5 refills | Status: DC
Start: 2022-04-12 — End: 2023-10-17

## 2022-04-12 MED ORDER — METOPROLOL SUCCINATE ER 25 MG PO TB24
25 MG | ORAL_TABLET | Freq: Every day | ORAL | 5 refills | Status: AC
Start: 2022-04-12 — End: ?

## 2022-04-12 MED ORDER — LORAZEPAM 2 MG/ML PO CONC
2 MG/ML | Freq: Every evening | ORAL | 3 refills | Status: DC
Start: 2022-04-12 — End: 2022-04-17

## 2022-04-12 MED ORDER — DIPHENHYDRAMINE HCL 12.5 MG/5ML PO LIQD
12.5 MG/5ML | Freq: Four times a day (QID) | ORAL | 5 refills | Status: AC | PRN
Start: 2022-04-12 — End: 2023-07-04

## 2022-04-12 MED ORDER — VITAMIN D 12.5 MCG/0.25ML PO LIQD
12.50.25 MCG/0.25ML | Freq: Every day | ORAL | 5 refills | Status: DC
Start: 2022-04-12 — End: 2023-10-17

## 2022-04-12 MED ORDER — DIPHENHYDRAMINE HCL 50 MG/ML IJ SOLN
50 MG/ML | INTRAMUSCULAR | 5 refills | Status: DC | PRN
Start: 2022-04-12 — End: 2022-04-30

## 2022-04-12 MED ORDER — DIPHENOXYLATE-ATROPINE 2.5-0.025 MG/5ML PO LIQD
Freq: Four times a day (QID) | ORAL | 5 refills | Status: AC | PRN
Start: 2022-04-12 — End: 2022-05-12

## 2022-04-12 MED ORDER — MYCOPHENOLATE MOFETIL 250 MG PO CAPS
250 MG | ORAL_CAPSULE | Freq: Two times a day (BID) | ORAL | 5 refills | Status: AC
Start: 2022-04-12 — End: ?

## 2022-04-12 MED ORDER — POTASSIUM CHLORIDE 40 MEQ/15ML (20%) PO SOLN
401520 MEQ/15ML (20%) | Freq: Every day | ORAL | 5 refills | Status: DC
Start: 2022-04-12 — End: 2023-10-17

## 2022-04-12 MED ORDER — SUCRALFATE 1 GM/10ML PO SUSP
1 GM/0ML | Freq: Four times a day (QID) | ORAL | 5 refills | Status: DC
Start: 2022-04-12 — End: 2022-09-12

## 2022-04-12 MED ORDER — CARBIDOPA-LEVODOPA 10-100 MG PO TABS
10-100 | ORAL_TABLET | Freq: Two times a day (BID) | ORAL | 5 refills | Status: DC
Start: 2022-04-12 — End: 2024-03-04

## 2022-04-12 NOTE — Progress Notes (Incomplete)
[facility]  Jacqueline Simmons, M.D.  [address]  [place], [place] [address]  Phone:  [phone]  Fax:  [phone]    CHIEF COMPLAINT:  Chief Complaint   Patient presents with    New Patient     New patient here to establish care with Dr Britta Mccreedy. PMH is significant for Parkinsons, Lupus, Malabsorption, anemia, chronic diarrhea, bone cancer, STILLS disease, gastroparesis, and cyclic vomiting syndrome.           HISTORY OF PRESENT ILLNESS:  Jacqueline Simmons is a 41 y.o. female  who presents as a new patient. Patient has had lupus since age 40. Also now has Parkinson's disease. Has multiple joint pain. Moved here from [place].       HISTORY:  Allergies   Allergen Reactions    Aspirin Hives, Itching, Rash, Shortness Of Breath and Swelling    Levaquin [Levofloxacin] Anaphylaxis    Bactrim [Sulfamethoxazole-Trimethoprim] Angioedema     Eyes swell    Nsaids Hives and Angioedema     Tongue swells         REVIEW OF SYSTEMS:  Review of systems is as indicated in HPI otherwise negative.    PHYSICAL EXAM:  Vital Signs -   Visit Vitals  BP 138/78   Pulse (!) 111   Temp 99.1 F (37.3 C) (Temporal)   Ht 5\' 1"  (1.549 m)   Wt 249 lb 4.8 oz (113.1 kg)   SpO2 100%   BMI 47.10 kg/m        ***    PHQ:  PHQ-9  04/12/2022   Little interest or pleasure in doing things 0   Feeling down, depressed, or hopeless 0   PHQ-2 Score 0   PHQ-9 Total Score 0       LABS  No results found for this visit on 04/12/22.  Office Visit on 11/28/2021   Component Date Value Ref Range Status    Diagnosis: 11/28/2021 Comment    Final    Comment: (NOTE)  NEGATIVE FOR INTRAEPITHELIAL LESION OR MALIGNANCY.      Specimen adequacy: 11/28/2021 Comment    Final    Comment: (NOTE)  Satisfactory for evaluation. Endocervical and/or squamous metaplastic  cells (endocervical component) are present.      Performed by: 11/28/2021 Comment    Final    Comment: (NOTE)  Damian Leavell, Cytotechnologist (ASCP)      Note: 11/28/2021 Comment    Final    Comment:  (NOTE)  The Pap smear is a screening test designed to aid in the detection of  premalignant and malignant conditions of the uterine cervix.  It is  not a diagnostic procedure and should not be used as the sole means  of detecting cervical cancer.  Both false-positive and false-negative  reports do occur.      Methodology: 11/28/2021 Comment    Final    Comment: (NOTE)  This liquid based ThinPrep(R) pap test was screened with the  use of an image guided system.      HPV Aptima 11/28/2021 Negative  Negative   Final    Comment: (NOTE)  This nucleic acid amplification test detects fourteen high-risk  HPV types (16,18,31,33,35,39,45,51,52,56,58,59,66,68) without  differentiation.      HPV Genotype Reflex 11/28/2021 Comment    Final    Comment: (NOTE)  Criteria not met, HPV Genotype not performed.  No. of containers.Marland Kitchen01 ThinPrep Vial  Performed At: [facility]  [address] [place], [place] [id number]  Clovis Riley  Claudie Fisherman MD RU:[id number]  Performed At: [facility]  [address] [place], [place] [id number]  Jolene Schimke MD ZH:[id number]         IMPRESSION/PLAN     Diagnosis Orders   1. Parkinson's disease (HCC)        2. Lupus (HCC)        3. Vitamin D deficiency        4. Chronic nausea            Follow up and Dispositions:  No follow-ups on file.     Patient is stable on medications and will continue current medications.  Refilled the above medications.  Will add the following medications:  Will change the following medications:  Reviewed medications and side effects in detail.  Was given samples of   Will check the above labs.  Will check labs before next visit.  Reviewed most recent labs.  Reviewed diet, exercise and weight control.  Cardiovascular risks and recommendations reviewed.  Patient encouraged to quit smoking.  Patient encouraged to take medications as prescribed.  Patient encouraged to follow a diabetic/low sodium diet.  Use of aspirin to prevent MIs and TIAs discussed.   Patient  will check blood sugars once daily.    Jacqueline Face, MD    Dictated using voice recognition software. Proofread, but unrecognized voice recognition errors may exist.

## 2022-04-12 NOTE — Progress Notes (Incomplete)
[facility]  Lu Duffel, M.D.  [address] [place] [address]  Phone:  [phone]  Fax:  [phone]    CHIEF COMPLAINT:  Chief Complaint   Patient presents with   . New Patient     New patient here to establish care with Dr Oletta Darter. PMH is significant for Parkinsons, Lupus, Malabsorption, anemia, chronic diarrhea, bone cancer, STILLS disease, gastroparesis, and cyclic vomiting syndrome.           HISTORY OF PRESENT ILLNESS:  Jacqueline Simmons is a 41 y.o. female  who presents as a new patient. She moved here from [place]. Her mother is Jacqueline Simmons, [place] clinical supervising nurse. Patient has had lupus since age 28. Also now has Parkinson's disease.  She was diagnosed with Parkinson's disease last year.  Has multiple joint pain.  She has bone cancer in her left leg.  She has had 10 surgeries on her left leg for a nonhealing ulcer.  The last one was 12/06/21. She has been having a lot of spasms in her leg after the surgery.  They took a muscle from her thoracic back on the left side to replace the muscle in her left leg.    Patient states that she has been having tachycardia for a while.  Today her pulse is 111.  She has shortness of breath with exertion.    She has been doing iron fusions due to a low hemoglobin.  She has heavy menstrual cycles.  She was told that she has somewhere between 5-7 uterine fibroids.  She has tried birth control pills without any relief.    She also has a history of gastroparesis, and chronic foul-smelling loose stools.  She denies any blood in her stools.    Patient states that she has a history of leukemia.  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    She has problems with insomnia.  She has been taking amitriptyline but the amitriptyline is not helping. Her mother states that she sometimes wakes her up screaming in pain. She will have sharp, shooting pains in her left leg (since the surgery). Her mother is asking if  she can get liquid lorazepam.    She also would like to see which medications can be changed to liquid. She has to take numerous medications. They can irritate her tongue.     No other complaints. Taking medications as prescribed. Medications reviewed.     HISTORY:  Allergies   Allergen Reactions   . Aspirin Hives, Itching, Rash, Shortness Of Breath and Swelling   . Levaquin [Levofloxacin] Anaphylaxis   . Bactrim [Sulfamethoxazole-Trimethoprim] Angioedema     Eyes swell   . Nsaids Hives and Angioedema     Tongue swells         REVIEW OF SYSTEMS:  Review of systems is as indicated in HPI otherwise negative.    PHYSICAL EXAM:  Vital Signs -   Visit Vitals  BP 138/78   Pulse (!) 111   Temp 99.1 F (37.3 C) (Temporal)   Ht _0  (1.549 m)   Wt 249 lb 4.8 oz (113.1 kg)   SpO2 100%   BMI 47.10 kg/m        Physical Exam  Vitals and nursing note reviewed.   Constitutional:       Appearance: Normal appearance. She is obese.   HENT:      Head: Normocephalic and atraumatic.  Nose: Nose normal.      Mouth/Throat:      Mouth: Mucous membranes are moist.   Eyes:      Extraocular Movements: Extraocular movements intact.      Pupils: Pupils are equal, round, and reactive to light.   Cardiovascular:      Rate and Rhythm: Normal rate and regular rhythm.      Pulses: Normal pulses.   Pulmonary:      Effort: Pulmonary effort is normal.      Breath sounds: Normal breath sounds.   Abdominal:      General: Abdomen is flat.      Palpations: Abdomen is soft.   Musculoskeletal:         General: Normal range of motion.      Cervical back: Normal range of motion and neck supple.   Skin:     General: Skin is warm and dry.   Neurological:      Mental Status: She is alert.   Psychiatric:         Mood and Affect: Mood normal.         Behavior: Behavior normal.         PHQ:  PHQ-9  04/12/2022   Little interest or pleasure in doing things 0   Feeling down, depressed, or hopeless 0   PHQ-2 Score 0   PHQ-9 Total Score 0       LABS  No results  found for this visit on 04/12/22.  Office Visit on 11/28/2021   Component Date Value Ref Range Status   . Diagnosis: 11/28/2021 Comment    Final    Comment: (NOTE)  NEGATIVE FOR INTRAEPITHELIAL LESION OR MALIGNANCY.     Marland Kitchen Specimen adequacy: 11/28/2021 Comment    Final    Comment: (NOTE)  Satisfactory for evaluation. Endocervical and/or squamous metaplastic  cells (endocervical component) are present.     . Performed by: 11/28/2021 Comment    Final    Comment: (NOTE)  Alwyn Ren, Cytotechnologist (ASCP)     . Note: 11/28/2021 Comment    Final    Comment: (NOTE)  The Pap smear is a screening test designed to aid in the detection of  premalignant and malignant conditions of the uterine cervix.  It is  not a diagnostic procedure and should not be used as the sole means  of detecting cervical cancer.  Both false-positive and false-negative  reports do occur.     . Methodology: 11/28/2021 Comment    Final    Comment: (NOTE)  This liquid based ThinPrep(R) pap test was screened with the  use of an image guided system.     Marland Kitchen HPV Aptima 11/28/2021 Negative  Negative   Final    Comment: (NOTE)  This nucleic acid amplification test detects fourteen high-risk  HPV types (16,18,31,33,35,39,45,51,52,56,58,59,66,68) without  differentiation.     Marland Kitchen HPV Genotype Reflex 11/28/2021 Comment    Final    Comment: (NOTE)  Criteria not met, HPV Genotype not performed.  No. of containers.Marland Kitchen01 ThinPrep Vial  Performed At: [facility]  [address] [place], [place] [id number]  Rush Farmer MD DQ:[id number]  Performed At: [facility]  [address] [place], [place] [id number]  Rush Farmer MD KG:[id number]         IMPRESSION/PLAN     Diagnosis Orders   1. Parkinson's disease ([place])        2. Lupus ([place])        3. Vitamin D deficiency  4. Chronic nausea            Follow up and Dispositions:  No follow-ups on file.     Patient is stable on medications and will continue current medications.  Refilled the above  medications.  Will add the following medications:  Will change the following medications:  Reviewed medications and side effects in detail.  Was given samples of   Will check the above labs.  Will check labs before next visit.  Reviewed most recent labs.  Reviewed diet, exercise and weight control.  Cardiovascular risks and recommendations reviewed.  Patient encouraged to quit smoking.  Patient encouraged to take medications as prescribed.  Patient encouraged to follow a diabetic/low sodium diet.  Use of aspirin to prevent MIs and TIAs discussed.   Patient will check blood sugars once daily.    Lu Duffel, MD    Dictated using voice recognition software. Proofread, but unrecognized voice recognition errors may exist.

## 2022-04-15 NOTE — Care Coordination-Inpatient (Signed)
PATIENT INFORMATION   Patient Name: Jacqueline Simmons, Jacqueline Simmons [facility] Acct: [id number] Patient MRN: [id number]   Address: [place] [address] Patient CSN: [id number]      Religion: None   Sex: Female Marital Status: Single   DOB: 12-18-1980 Age:   41 yrs   Home Phone: [phone]  Mobile Phone:   [phone]     1.00   Race: Black / African American Employer:   [employer]   Language: English Admitted/Arrived From:      ADMISSION INFORMATION   Admit Date: 10/26/2021 Admit Time: 1210   Patient Class: Outreach Service:     Admit Source: Physician or clinic offi* Admit Type: Elective   Admitting Provider:   Attending Provider:     Unit: [place] Room/Bed: Room/bed info not found    Admission Diagnosis:   and DX codes:    Emergency Complaint:                 Discharge Date: 10/29/2021 Discharge Time: [place] INFORMATION   Name:  Levay,Makell Address: [place]  Rel:  Self   Phone: [phone]   [place] [address] DOB: 1980/11/12   EMERGENCY CONTACTS   Name: Weldon Home:  Mobile: [phone]    Rel: Parent   COVERAGE INFORMATION   Primary Insurance:   BCBS Subscriber: Craig Guess   Plan Name: Bc[place] Pt Rel to Subscriber: Self [01]   Claim Address: [place] [address] Sex: Female      Policy #:  [id number]    Group #: [id number] [phone] Group Name:   [employer]   Auth #: Auth number: N/A Ins Phone:         Secondary Insurance:   Subscriber:     Plan Name:   Pt Rel to Subscriber:     Claim Address: NA Sex:       Policy #: N/A    Group #: N/A Group Name: N/A   Information systems manager #: N/A Ins Phone:         Accident Date:    Accident Type:     PROVIDER INFORMATION   PCP:         Katharina Caper* PCP Phone:  [phone]   Referring Prov:   Howie Ill, MD Referring Phone:  Referring Fax:  [phone]   Advanced Directive:  <no information> Research:     Lab Client:   Enrollment Status:          Faxed to [facility]

## 2022-04-15 NOTE — Care Coordination-Inpatient (Signed)
Received referral for this pt.  Pt is uninsured and needs resources.  Pt referred to [facility] and will be referred to [facility] if [facility] is unable to assist.

## 2022-04-16 NOTE — Care Coordination-Inpatient (Signed)
Received call from [facility] with [facility].  She had a few questions.  SW CM reached out to pt for those answers-no answer-left VM requesting call back.  Also reached out to Echo with [employer] (Education officer, museum) to inquire if has has seen pt.  Awaiting response.

## 2022-04-16 NOTE — Care Coordination-Inpatient (Signed)
Shanita with [facility] requested demo sheet and further pt information.  All information requested sent to [facility].  Awaiting to hear from [facility] regarding pt's eligibility for their services.

## 2022-04-17 ENCOUNTER — Telehealth

## 2022-04-17 MED ORDER — LORAZEPAM 2 MG/ML PO CONC
2 MG/ML | ORAL | 3 refills | Status: AC | PRN
Start: 2022-04-17 — End: 2022-05-17

## 2022-04-17 NOTE — Care Coordination-Inpatient (Signed)
Per Julieanne Manson, she does not recognize pt.  Shanita with [facility] has requested that [facility] reach out to pt to schedule for SW appt there.  [facility] notified of request.

## 2022-04-17 NOTE — Telephone Encounter (Signed)
Patient's mother states that she has been having problems with anxiety and neuropathic pain.  She is asking if we can increase her lorazepam to 1 mg every 4 hours.

## 2022-04-18 NOTE — Care Coordination-Inpatient (Signed)
SW CM left Vm with pt letting her know that she should expect a call from Vidette at [facility].  She can reach out to me with any questions.

## 2022-04-25 ENCOUNTER — Encounter

## 2022-04-30 ENCOUNTER — Telehealth

## 2022-04-30 MED ORDER — DIPHENHYDRAMINE HCL 50 MG/ML IJ SOLN
50 MG/ML | INTRAMUSCULAR | 5 refills | Status: AC | PRN
Start: 2022-04-30 — End: 2022-07-10

## 2022-04-30 NOTE — Telephone Encounter (Signed)
Her mother is asking if the Diphenhydramine 50 mg vials via portacath to 1-2 ml every 4-6 hours prn.

## 2022-05-02 ENCOUNTER — Encounter

## 2022-05-02 MED ORDER — LORAZEPAM 2 MG/ML PO CONC
2 MG/ML | ORAL | 3 refills | Status: DC | PRN
Start: 2022-05-02 — End: 2022-07-03

## 2022-05-02 NOTE — Telephone Encounter (Signed)
Patient's mother called asking if her Ativan could be increased to 2 mg every 4 hours as needed because her anxiety has worsened.  She has been diagnosed with thyroid cancer.

## 2022-05-15 ENCOUNTER — Encounter: Attending: Family Medicine | Primary: Family Medicine

## 2022-06-19 ENCOUNTER — Encounter

## 2022-06-19 ENCOUNTER — Telehealth

## 2022-06-19 MED ORDER — ESZOPICLONE 3 MG PO TABS
3 MG | ORAL_TABLET | Freq: Every evening | ORAL | 5 refills | Status: AC
Start: 2022-06-19 — End: 2022-07-19

## 2022-06-19 MED ORDER — TRAMADOL HCL 50 MG PO TABS
50 MG | ORAL_TABLET | Freq: Four times a day (QID) | ORAL | 5 refills | Status: AC | PRN
Start: 2022-06-19 — End: 2022-07-19

## 2022-06-19 MED ORDER — OXYCODONE-ACETAMINOPHEN 5-325 MG PO TABS
5-325 MG | ORAL_TABLET | Freq: Three times a day (TID) | ORAL | 0 refills | Status: AC | PRN
Start: 2022-06-19 — End: 2022-07-19

## 2022-06-19 NOTE — Telephone Encounter (Signed)
Spoke with Jacqueline Simmons from [employer]. Dr Oletta Darter dispensed Oxycodone 5 mg #90 on 06/19/22/.   A Dr Tonye Royalty dispensed Oxycodone 5 mg #60 on 06/14/22. Provider please advise.

## 2022-06-19 NOTE — Telephone Encounter (Signed)
Pharmacy requesting a call about percocet    [phone]    Looks like pt is getting medication from another doctor at a different address - please call pharmacy back

## 2022-06-19 NOTE — Telephone Encounter (Signed)
Patient needs refills. She is not sleeping.

## 2022-06-20 MED ORDER — OXYCODONE-ACETAMINOPHEN 10-325 MG PO TABS
10-325 MG | ORAL_TABLET | Freq: Four times a day (QID) | ORAL | 0 refills | Status: AC | PRN
Start: 2022-06-20 — End: 2022-07-20

## 2022-06-20 NOTE — Addendum Note (Signed)
Addended by: Foster Simpson F on: 06/20/2022 03:28 PM     Modules accepted: Orders

## 2022-06-20 NOTE — Telephone Encounter (Signed)
Pharmacist notified. Pt had already picked up the #60 from the other provider, so rx from Dr Oletta Darter was cancelled.

## 2022-06-28 ENCOUNTER — Telehealth

## 2022-06-28 MED ORDER — MORPHINE SULFATE (CONCENTRATE) 20 MG/ML PO SOLN
20 MG/ML | ORAL | 0 refills | Status: DC | PRN
Start: 2022-06-28 — End: 2022-07-23

## 2022-06-28 NOTE — Telephone Encounter (Signed)
Patient is having a lupus flare. She is in extreme pain. The percocet is not helping the pain. She has the butterfly rash. She is unable to get out of bed. She is asking for a few days of liquid morphine to help the severe pain until the lupus flare improves.      Will send liquid morphine to take every 2 hours as needed until the lupus flare subsides. She will then go back to the percocet.

## 2022-07-03 ENCOUNTER — Telehealth

## 2022-07-03 NOTE — Telephone Encounter (Signed)
Patient's mother called and stated that she is in a lot of pain. The pain worsens her anxiety. She is taking Ativan 2 mg every 4 hours. It is not lasting the full 4 hours. Jacqueline Simmons has been crying in pain for the past 3 days. She also has problems with reflux. She has been taking Nexium, Pepto Bismol and TUMS without relief. She has tried Sucralfate in the past without any relief. She is taking the liquid morphine as well.    Will change Nexium to Protonix and increase Ativan to 2 mg every 3 hours as needed while having the acute pain.

## 2022-07-04 MED ORDER — LORAZEPAM 2 MG/ML PO CONC
2 MG/ML | ORAL | 3 refills | Status: DC | PRN
Start: 2022-07-04 — End: 2023-01-27

## 2022-07-04 MED ORDER — PANTOPRAZOLE SODIUM 40 MG PO TBEC
40 MG | ORAL_TABLET | Freq: Every day | ORAL | 5 refills | Status: AC
Start: 2022-07-04 — End: ?

## 2022-07-07 ENCOUNTER — Telehealth

## 2022-07-07 NOTE — Telephone Encounter (Signed)
Patient has been having a lot of pain from the lupus as well as the left leg. She has had extensive skin grafting on the left leg. Her pain makes her anxiety worse. She is now doing percocet with morphine drops in between. Before they moved to [facility], her previous pain clinic had discussed with her the possibility of the pain pump or nerve stimulator. She wants to be referred to pain management so she can have the pain pump placed.     Will refer to Dr. Valinda Hoar at [phone].

## 2022-07-10 ENCOUNTER — Encounter

## 2022-07-10 NOTE — Telephone Encounter (Signed)
Patient needs refills. She is having to take the the Percocet every 4 hours for pain.

## 2022-07-11 ENCOUNTER — Telehealth

## 2022-07-11 MED ORDER — OXYCODONE-ACETAMINOPHEN 10-325 MG PO TABS
10-325 MG | ORAL_TABLET | ORAL | 0 refills | Status: AC | PRN
Start: 2022-07-11 — End: 2022-08-09

## 2022-07-11 MED ORDER — OXYCODONE-ACETAMINOPHEN 10-325 MG PO TABS
10-325 MG | ORAL_TABLET | ORAL | 0 refills | Status: DC | PRN
Start: 2022-07-11 — End: 2022-08-15

## 2022-07-11 MED ORDER — DIPHENHYDRAMINE HCL 50 MG/ML IJ SOLN
50 MG/ML | INTRAMUSCULAR | 5 refills | Status: DC
Start: 2022-07-11 — End: 2022-09-18

## 2022-07-11 NOTE — Telephone Encounter (Signed)
Per Dr Oletta Darter, rx should last 15 days and may call office for refill when needed. Left message for pharmacist.

## 2022-07-11 NOTE — Telephone Encounter (Signed)
Verdis Frederickson- pharmacist at [facility]-- has a question on patients Oxycodone would like to know if 90 tablets must last 30 days or if its a 15 day supply [phone]

## 2022-07-11 NOTE — Addendum Note (Signed)
Addended by: Foster Simpson F on: 07/11/2022 03:13 PM     Modules accepted: Orders

## 2022-07-23 ENCOUNTER — Encounter

## 2022-07-23 MED ORDER — MORPHINE SULFATE (CONCENTRATE) 20 MG/ML PO SOLN
20 MG/ML | ORAL | 0 refills | Status: AC | PRN
Start: 2022-07-23 — End: 2022-08-22

## 2022-07-23 NOTE — Telephone Encounter (Signed)
Patient is having more breakthrough pain and is needing more Morphine.

## 2022-08-05 ENCOUNTER — Encounter: Attending: Family Medicine | Primary: Family Medicine

## 2022-08-06 ENCOUNTER — Encounter

## 2022-08-06 MED ORDER — TIZANIDINE HCL 4 MG PO TABS
4 MG | ORAL_TABLET | ORAL | 5 refills | Status: AC
Start: 2022-08-06 — End: ?

## 2022-08-06 NOTE — Telephone Encounter (Signed)
Patient needs a refill of the Zanaflex.

## 2022-08-12 ENCOUNTER — Encounter: Attending: Family Medicine | Primary: Family Medicine

## 2022-08-15 ENCOUNTER — Encounter

## 2022-08-15 MED ORDER — OXYCODONE-ACETAMINOPHEN 10-325 MG PO TABS
10-325 MG | ORAL_TABLET | ORAL | 0 refills | Status: DC | PRN
Start: 2022-08-15 — End: 2022-09-17

## 2022-08-15 NOTE — Telephone Encounter (Signed)
Patient is needing refills.

## 2022-09-09 ENCOUNTER — Encounter: Attending: Family Medicine | Primary: Family Medicine

## 2022-09-12 ENCOUNTER — Ambulatory Visit: Admit: 2022-09-12 | Discharge: 2022-09-12 | Attending: Family Medicine | Primary: Family Medicine

## 2022-09-12 DIAGNOSIS — M329 Systemic lupus erythematosus, unspecified: Secondary | ICD-10-CM

## 2022-09-12 MED ORDER — MORPHINE SULFATE (CONCENTRATE) 20 MG/ML PO SOLN
20 MG/ML | ORAL | 0 refills | Status: AC | PRN
Start: 2022-09-12 — End: 2022-10-12

## 2022-09-12 MED ORDER — ONDANSETRON 4 MG PO TBDP
4 MG | ORAL_TABLET | Freq: Three times a day (TID) | ORAL | 5 refills | Status: AC | PRN
Start: 2022-09-12 — End: 2023-01-30

## 2022-09-12 MED ORDER — EPINEPHRINE 0.3 MG/0.3ML IJ SOAJ
0.3 | Freq: Once | INTRAMUSCULAR | 5 refills | Status: DC
Start: 2022-09-12 — End: 2024-06-06

## 2022-09-12 NOTE — Progress Notes (Signed)
[facility]  Jacqueline Simmons, M.D.  [address] [place] [address]  Phone:  [phone]  Fax:  [phone]    CHIEF COMPLAINT:  Chief Complaint   Patient presents with    Vomiting Blood     Patient states she gets a full feeling in her stomach and then will projectile vomit. This has been happening every day, several times a day for the past 2 weeks. This is also accompanied by diarrhea. She thinks she has seen "worms" in the toilet after having a bowel movement.      Rash     Patient states she has been getting random rashes on her body over the past 2 weeks. The rashes always look the same, they look like hives. They itch and burn. They last for about an hour, then disappear. Patient denies any known allergens and has not used any new products.           HISTORY OF PRESENT ILLNESS:  Jacqueline Simmons is a 42 y.o. female  who presents for follow up. She has been having episodes of projectile vomiting. She will suddenly feel full then start vomiting. The emesis is bright green. She will have severe pain in the epigastric region. She has been taking Protonix but it is not helping.     She also has been having diarrhea. Patient says she has been seeing possible worms in her stools.     She has been losing weight. She was 249 lbs and is now 227 lbs.     She has been getting a rash on her arms. The rash is red and hurts.     No other complaints. Taking medications as prescribed. Medications reviewed and updated.     HISTORY:  Allergies   Allergen Reactions    Aspirin Hives, Itching, Rash, Shortness Of Breath and Swelling    Levaquin [Levofloxacin] Anaphylaxis    Bactrim [Sulfamethoxazole-Trimethoprim] Angioedema     Eyes swell    Nsaids Hives and Angioedema     Tongue swells         REVIEW OF SYSTEMS:  Review of systems is as indicated in HPI otherwise negative.    PHYSICAL EXAM:  Vital Signs -   Visit Vitals  BP 136/84   Pulse 98   Temp 98.1 F (36.7 C) (Temporal)   Ht 1.549 m ('5\' 1"'$ )   Wt  103.2 kg (227 lb 9.6 oz)   SpO2 99%   BMI 43.00 kg/m        Physical Exam  Vitals and nursing note reviewed.   Constitutional:       Appearance: Normal appearance. She is obese.   HENT:      Head: Normocephalic and atraumatic.      Nose: Nose normal.      Mouth/Throat:      Mouth: Mucous membranes are moist.   Eyes:      Extraocular Movements: Extraocular movements intact.      Pupils: Pupils are equal, round, and reactive to light.   Cardiovascular:      Rate and Rhythm: Normal rate and regular rhythm.      Pulses: Normal pulses.   Pulmonary:      Effort: Pulmonary effort is normal.      Breath sounds: Normal breath sounds.   Abdominal:      General: Abdomen is flat.      Palpations: Abdomen is soft.   Musculoskeletal:  General: Normal range of motion.      Cervical back: Normal range of motion and neck supple. Tenderness present.      Thoracic back: Spasms and tenderness present.      Lumbar back: Spasms and tenderness present.        Back:         Legs:       Comments: Evidence of skin grafting on the LLE. Moderate swelling in the left lower leg and foot.      Skin:     General: Skin is warm and dry.   Neurological:      Mental Status: She is alert and oriented to person, place, and time.      Cranial Nerves: Cranial nerves 2-12 are intact.      Sensory: Sensation is intact.      Motor: Weakness present.      Coordination: Coordination abnormal.      Gait: Gait abnormal.      Comments: She is walker with crutches. She has a wheelchair at home.   Psychiatric:         Mood and Affect: Mood normal.         Behavior: Behavior normal.       PHQ:      09/12/2022     9:45 AM   PHQ-9    Little interest or pleasure in doing things 0   Feeling down, depressed, or hopeless 0   PHQ-2 Score 0   PHQ-9 Total Score 0       IMPRESSION/PLAN     Diagnosis Orders   1. Lupus arthritis (HCC)  morphine sulfate 20 MG/ML concentrated oral solution      2. Rash due to systemic lupus erythematosus (SLE) (HCC)        3. Other chronic  pain  morphine sulfate 20 MG/ML concentrated oral solution      4. Chronic nausea  ondansetron (ZOFRAN-ODT) 4 MG disintegrating tablet    CT ABDOMEN PELVIS WO CONTRAST Additional Contrast? None    Culture, Stool    [facility]      5. Projectile vomiting with nausea  CT ABDOMEN PELVIS WO CONTRAST Additional Contrast? None    Culture, Stool    [facility]      6. Epigastric pain  CT ABDOMEN PELVIS WO CONTRAST Additional Contrast? None    Culture, Stool    [facility]      7. Weight loss  CT ABDOMEN PELVIS WO CONTRAST Additional Contrast? None    Culture, Stool    [facility]      8. Diarrhea, unspecified type  CT ABDOMEN PELVIS WO CONTRAST Additional Contrast? None    Culture, Stool    [facility]      9. Encounter for long-term (current) use of medications            Follow up and Dispositions:  Return in about 4 months (around 01/11/2023).     Patient will continue current medications.  Refilled the above medications.  Will add the following medications: Zofran 4 mg ODT 1 to 2 tablets as needed for nausea.  Reviewed medications and side effects in detail.  Will check the above labs.  Reviewed most recent labs.  Reviewed diet, exercise and weight control.  Cardiovascular risks and recommendations reviewed.  Patient  encouraged to follow a low sodium diet.  Use of aspirin to prevent MIs and TIAs discussed.   Will get a stool culture.  Will get a CT of the abdomen.  Will refer to the gastroenterologist.    Jacqueline Duffel, MD    Dictated using voice recognition software. Proofread, but unrecognized voice recognition errors may exist.

## 2022-09-17 ENCOUNTER — Encounter

## 2022-09-17 MED ORDER — OXYCODONE-ACETAMINOPHEN 10-325 MG PO TABS
10-325 MG | ORAL_TABLET | ORAL | 0 refills | Status: DC | PRN
Start: 2022-09-17 — End: 2022-10-25

## 2022-09-17 MED ORDER — MORPHINE SULFATE (CONCENTRATE) 20 MG/ML PO SOLN
20 MG/ML | ORAL | 0 refills | Status: DC | PRN
Start: 2022-09-17 — End: 2023-01-24

## 2022-09-17 MED ORDER — TRAMADOL HCL 50 MG PO TABS
50 MG | ORAL_TABLET | Freq: Four times a day (QID) | ORAL | 5 refills | Status: AC | PRN
Start: 2022-09-17 — End: 2022-10-17

## 2022-09-17 NOTE — Telephone Encounter (Signed)
Patient needs medications sent to [facility] due to cost.

## 2022-09-18 ENCOUNTER — Telehealth

## 2022-09-18 MED ORDER — DIPHENHYDRAMINE HCL 50 MG/ML IJ SOLN
50 MG/ML | INTRAMUSCULAR | 5 refills | Status: AC
Start: 2022-09-18 — End: 2022-12-14

## 2022-09-18 NOTE — Telephone Encounter (Signed)
Pharmacist stated that anytime morphine is prescribed, they have to verify that it was sent over w/ the directions to make sure correct. Advised pharmacy it is okay to fill as precscribed. Pharmacist wanted to know if patient was on hospice. I do not see in chart that patient is on hospice. Pharmacist said she will go ahead and fill medication.

## 2022-09-18 NOTE — Telephone Encounter (Signed)
Jacqueline Simmons from [facility] wanting clarification on the morphine sulfate.  Entire prescription must be verified before the pharmacy can fill it.

## 2022-10-02 ENCOUNTER — Encounter

## 2022-10-02 NOTE — Telephone Encounter (Signed)
Patient needs refills.

## 2022-10-03 MED ORDER — ESZOPICLONE 3 MG PO TABS
3 | ORAL_TABLET | Freq: Every evening | ORAL | 5 refills | Status: AC
Start: 2022-10-03 — End: 2022-11-01

## 2022-10-24 ENCOUNTER — Telehealth

## 2022-10-24 MED ORDER — ZOLPIDEM TARTRATE 10 MG PO TABS
10 | ORAL_TABLET | Freq: Every evening | ORAL | 5 refills | Status: AC | PRN
Start: 2022-10-24 — End: 2022-11-07

## 2022-10-24 NOTE — Telephone Encounter (Signed)
Patient states that the Johnnye Sima is no longer working. She wants to go back to the Ambien. She used to be on 10 mg.

## 2022-10-25 ENCOUNTER — Encounter

## 2022-10-25 MED ORDER — OXYCODONE-ACETAMINOPHEN 10-325 MG PO TABS
10-325 MG | ORAL_TABLET | ORAL | 0 refills | Status: DC | PRN
Start: 2022-10-25 — End: 2022-11-30

## 2022-10-25 NOTE — Telephone Encounter (Signed)
Patient needs refills.

## 2022-11-05 ENCOUNTER — Telehealth

## 2022-11-05 MED ORDER — HYDROMORPHONE HCL 3 MG RE SUPP
3 MG | Freq: Four times a day (QID) | RECTAL | 0 refills | Status: AC | PRN
Start: 2022-11-05 — End: 2022-12-05

## 2022-11-05 NOTE — Telephone Encounter (Signed)
Patient's mother called because Jacqueline Simmons has been in excruciating pain over the past few days. The percocet and morphine are not helping the pain. Her mother is a Marine scientist. She is asking can we do dilaudid IV or suppositories to see if that will help.

## 2022-11-06 NOTE — Addendum Note (Signed)
Addended by: Foster Simpson F on: 11/06/2022 09:13 PM     Modules accepted: Orders

## 2022-11-07 MED ORDER — HYDROMORPHONE HCL 3 MG RE SUPP
3 MG | Freq: Four times a day (QID) | RECTAL | 0 refills | Status: AC | PRN
Start: 2022-11-07 — End: 2022-12-06

## 2022-11-11 NOTE — Addendum Note (Signed)
Addended by: Foster Simpson F on: 11/11/2022 10:42 PM     Modules accepted: Orders

## 2022-11-11 NOTE — Telephone Encounter (Signed)
Patient's mother called and stated that the Dilaudid suppositories are on back order. She is asking can we send in a liquid.    Will change the suppositories to liquid.

## 2022-11-12 MED ORDER — HYDROMORPHONE HCL 1 MG/ML PO LIQD
1 MG/ML | ORAL | 0 refills | Status: AC | PRN
Start: 2022-11-12 — End: 2022-12-11

## 2022-11-12 NOTE — Telephone Encounter (Signed)
[facility] in [place] is needing a call back to clarify some medication

## 2022-11-13 NOTE — Telephone Encounter (Signed)
Spoke with pharmacist, she was needing qualifying diagnoses for high doses of pain medication. List was given to pharmacist.

## 2022-11-30 ENCOUNTER — Encounter

## 2022-11-30 MED ORDER — OXYCODONE-ACETAMINOPHEN 10-325 MG PO TABS
10-325 MG | ORAL_TABLET | ORAL | 0 refills | Status: DC | PRN
Start: 2022-11-30 — End: 2022-12-30

## 2022-11-30 NOTE — Telephone Encounter (Signed)
Patient needs a refill the percocet.

## 2022-12-09 ENCOUNTER — Encounter: Attending: Family Medicine | Primary: Family Medicine

## 2022-12-09 ENCOUNTER — Encounter: Primary: Family Medicine

## 2022-12-09 ENCOUNTER — Encounter

## 2022-12-09 NOTE — Telephone Encounter (Signed)
From: Isabelly Mcdevitt  To: Dr. Oletta Cohn  Sent: 12/09/2022 8:21 PM EDT  Subject: Refill Request    Hi Dr Britta Mccreedy!  Hope all is well with you. I need to make an appointment to come see you but I'm the meantime is it possible to get a refill on the liquid dilaudid   Thank You!

## 2022-12-10 MED ORDER — HYDROMORPHONE HCL 1 MG/ML PO LIQD
1 | ORAL | 0 refills | Status: DC | PRN
Start: 2022-12-10 — End: 2023-03-12

## 2022-12-11 NOTE — Telephone Encounter (Signed)
Called [facility] to schedule appt on 4/19 at 11. She did not answer so I left a message for to her call back - per [employer] [facility]

## 2022-12-14 ENCOUNTER — Encounter

## 2022-12-14 MED ORDER — DIPHENHYDRAMINE HCL 50 MG/ML IJ SOLN
50 MG/ML | INTRAMUSCULAR | 5 refills | Status: DC
Start: 2022-12-14 — End: 2023-07-04

## 2022-12-14 NOTE — Telephone Encounter (Signed)
Patient needs a refill of the Benadryl.

## 2022-12-30 ENCOUNTER — Encounter

## 2022-12-30 MED ORDER — OXYCODONE-ACETAMINOPHEN 10-325 MG PO TABS
10-325 MG | ORAL_TABLET | ORAL | 0 refills | Status: AC | PRN
Start: 2022-12-30 — End: 2023-01-29

## 2022-12-30 NOTE — Telephone Encounter (Signed)
Patient needs a refill on her percocet.

## 2023-01-24 ENCOUNTER — Encounter

## 2023-01-24 MED ORDER — MORPHINE SULFATE (CONCENTRATE) 20 MG/ML PO SOLN
20 | ORAL | 0 refills | Status: DC
Start: 2023-01-24 — End: 2023-03-12

## 2023-01-24 MED ORDER — MORPHINE SULFATE (CONCENTRATE) 20 MG/ML PO SOLN
20 | ORAL | 0 refills | Status: DC | PRN
Start: 2023-01-24 — End: 2023-03-12

## 2023-01-24 MED ORDER — MORPHINE SULFATE (CONCENTRATE) 20 MG/ML PO SOLN
20 | ORAL | 0 refills | Status: DC | PRN
Start: 2023-01-24 — End: 2023-01-24

## 2023-01-24 NOTE — Telephone Encounter (Signed)
Pharmacist called stating that the morphine RX needs to be changed/increased so it will cover the #240 ml because they do not have the 120 ml bottle.

## 2023-01-24 NOTE — Telephone Encounter (Signed)
Pharmacist from [facility] calling, they do not have morphine 120 ml in stock, it will be Tuesday before they can get that size bottle in. Pharmacy does have in stock a bottle (roughly a 40 day supply), they are requesting new rx be sent in with that quantity. Requested medication pended for PCP approval.

## 2023-01-24 NOTE — Telephone Encounter (Signed)
Patient needs a refill of the morphine.

## 2023-01-24 NOTE — Addendum Note (Signed)
Addended by: Amada Kingfisher F on: 01/24/2023 02:15 PM     Modules accepted: Orders

## 2023-01-26 ENCOUNTER — Encounter

## 2023-01-27 MED ORDER — LORAZEPAM 2 MG/ML PO CONC
2 MG/ML | ORAL | 3 refills | Status: AC | PRN
Start: 2023-01-27 — End: 2023-02-26

## 2023-01-27 NOTE — Addendum Note (Signed)
Addended by: Amada Kingfisher F on: 01/27/2023 01:16 PM     Modules accepted: Orders

## 2023-01-30 ENCOUNTER — Encounter

## 2023-01-30 MED ORDER — ONDANSETRON 4 MG PO TBDP
4 MG | ORAL_TABLET | Freq: Three times a day (TID) | ORAL | 5 refills | Status: AC | PRN
Start: 2023-01-30 — End: 2023-09-13

## 2023-01-30 NOTE — Telephone Encounter (Signed)
Patient needs a refill of Zofran.

## 2023-02-15 ENCOUNTER — Encounter

## 2023-02-15 NOTE — Telephone Encounter (Signed)
Patient needs a refill of percocet.

## 2023-02-16 MED ORDER — OXYCODONE-ACETAMINOPHEN 10-325 MG PO TABS
10-325 MG | ORAL_TABLET | ORAL | 0 refills | Status: AC | PRN
Start: 2023-02-16 — End: 2023-03-17

## 2023-03-12 ENCOUNTER — Encounter

## 2023-03-12 MED ORDER — LORAZEPAM 2 MG PO TABS
2 MG | ORAL_TABLET | Freq: Three times a day (TID) | ORAL | 3 refills | Status: AC | PRN
Start: 2023-03-12 — End: 2023-04-11

## 2023-03-12 MED ORDER — FENTANYL 50 MCG/HR TD PT72
50 MCG/HR | MEDICATED_PATCH | TRANSDERMAL | 0 refills | Status: AC
Start: 2023-03-12 — End: 2023-04-11

## 2023-03-12 NOTE — Telephone Encounter (Signed)
Call received from her mother. She continues to have extreme pain crisis at night. It lasts all night. It will last for several nights in a row.   Will change her pain medications to a Fentanyl 50 mcg every 72 hours and percocet for breakthrough pain. Will change liquid Ativan to Ativan 2 mg tablets every 8 hours.

## 2023-03-20 ENCOUNTER — Encounter

## 2023-03-20 MED ORDER — OXYCODONE-ACETAMINOPHEN 10-325 MG PO TABS
10-325 | ORAL_TABLET | ORAL | 0 refills | Status: AC | PRN
Start: 2023-03-20 — End: 2023-04-19

## 2023-03-20 MED ORDER — TEMAZEPAM 30 MG PO CAPS
30 MG | ORAL_CAPSULE | Freq: Every evening | ORAL | 5 refills | Status: DC | PRN
Start: 2023-03-20 — End: 2023-04-02

## 2023-03-20 NOTE — Telephone Encounter (Signed)
Patient's mother called and stated that Jacqueline Simmons is not sleeping well. She wants to change the Lorazepam to Temazepam.

## 2023-04-02 ENCOUNTER — Telehealth

## 2023-04-02 MED ORDER — TEMAZEPAM 30 MG PO CAPS
30 MG | ORAL_CAPSULE | Freq: Two times a day (BID) | ORAL | 3 refills | Status: AC
Start: 2023-04-02 — End: 2023-05-02

## 2023-04-02 NOTE — Telephone Encounter (Signed)
Patient is still having problems sleeping. She has severe pain in her leg. It keeps her awake at night. Her mother is asking if the Temazepam can be increased.    Will try to increase to bid at her request.

## 2023-04-11 ENCOUNTER — Telehealth: Admit: 2023-04-11 | Discharge: 2023-04-11 | Attending: Family Medicine | Primary: Family Medicine

## 2023-04-11 DIAGNOSIS — M329 Systemic lupus erythematosus, unspecified: Secondary | ICD-10-CM

## 2023-04-11 MED ORDER — FENTANYL 75 MCG/HR TD PT72
75 | MEDICATED_PATCH | TRANSDERMAL | 0 refills | Status: AC
Start: 2023-04-11 — End: 2023-05-11

## 2023-04-11 NOTE — Progress Notes (Unsigned)
[facility]  [name], M.D.  [address]  [place], [place] [address]  Phone:  [phone]  Fax:  [phone]          I was in the office while conducting this encounter.  The patient was at home.    CHIEF COMPLAINT:  Chief Complaint   Patient presents with    Lupus     She has chronic pain from lupus. She has been using the Fentanyl patch and percocet for breakthrough pain. This helps during the day but she has severe pain at night.    Insomnia     Her pain in her legs wakes her up at night. The pain has been higher than a 10/10 scale. She wakes up screaming in pain at times.    Chronic Pain     She has chronic lupus arthritis.     Foamy urine      Her urine has been foamy recently. She would like to have her urine checked.          HISTORY OF PRESENT ILLNESS:  Jacqueline Simmons is a 42 y.o. female  who presents for follow up.     HISTORY:  Allergies   Allergen Reactions    Aspirin Hives, Itching, Rash, Shortness Of Breath and Swelling    Levaquin [Levofloxacin] Anaphylaxis    Bactrim [Sulfamethoxazole-Trimethoprim] Angioedema     Eyes swell    Nsaids Hives and Angioedema     Tongue swells         REVIEW OF SYSTEMS:  Review of systems is as indicated in HPI otherwise negative.    PHYSICAL EXAM:    Vital Signs: (As obtained by patient/caregiver at home)      04/10/2023     7:34 PM   Patient-Reported Vitals   Patient-Reported Weight 202   Patient-Reported Height 5'1   Patient-Reported Systolic 106 mmHg   Patient-Reported Diastolic 68 mmHg   Patient-Reported Pulse 114   Patient-Reported Temperature 98.7   Patient-Reported SpO2 99%         PHQ:      09/12/2022     9:45 AM   PHQ-9    Little interest or pleasure in doing things 0   Feeling down, depressed, or hopeless 0   PHQ-2 Score 0   PHQ-9 Total Score 0       LABS  No results found for this visit on 04/11/23.  Office Visit on 11/28/2021   Component Date Value Ref Range Status    Diagnosis: 11/28/2021 Comment    Final    Comment: (NOTE)  NEGATIVE  FOR INTRAEPITHELIAL LESION OR MALIGNANCY.      Specimen adequacy: 11/28/2021 Comment    Final    Comment: (NOTE)  Satisfactory for evaluation. Endocervical and/or squamous metaplastic  cells (endocervical component) are present.      Performed by: 11/28/2021 Comment    Final    Comment: (NOTE)  Damian Leavell, Cytotechnologist (ASCP)      Note: 11/28/2021 Comment    Final    Comment: (NOTE)  The Pap smear is a screening test designed to aid in the detection of  premalignant and malignant conditions of the uterine cervix.  It is  not a diagnostic procedure and should not be used as the sole means  of detecting cervical cancer.  Both false-positive and false-negative  reports do occur.      Methodology: 11/28/2021 Comment    Final    Comment: (NOTE)  This liquid  based ThinPrep(R) pap test was screened with the  use of an image guided system.      HPV Aptima 11/28/2021 Negative  Negative   Final    Comment: (NOTE)  This nucleic acid amplification test detects fourteen high-risk  HPV types (16,18,31,33,35,39,45,51,52,56,58,59,66,68) without  differentiation.      HPV Genotype Reflex 11/28/2021 Comment    Final    Comment: (NOTE)  Criteria not met, HPV Genotype not performed.  No. of containers.Marland Kitchen01 ThinPrep Vial  Performed At: [facility]  [address] [place], [place] [id number]  Jolene Schimke MD WU:[id number]  Performed At: [facility]  [address] [place],  [id number]  Jolene Schimke MD VH:[id number]         IMPRESSION/PLAN     Diagnosis Orders   1. Lupus arthritis (HCC)  fentaNYL (DURAGESIC) 75 MCG/HR      2. Parkinson's disease without dyskinesia, with fluctuating manifestations (HCC)        3. Breakthrough pain  fentaNYL (DURAGESIC) 75 MCG/HR      4. Other chronic pain  fentaNYL (DURAGESIC) 75 MCG/HR      5. Chronic fatigue  CBC with Auto Differential    Comprehensive Metabolic Panel      6. Foamy urine  Urinalysis with Reflex to Culture      7. Hyperglycemia  Hemoglobin A1C      8.  Vitamin D deficiency  Vitamin D 25 Hydroxy      9. History of anemia  Iron    Ferritin      10. Screening for lipid disorders  Lipid Panel      11. Encounter for long-term (current) use of medications  CBC with Auto Differential    Comprehensive Metabolic Panel          Follow up and Dispositions:  No follow-ups on file.     Patient is stable on medications and will continue current medications.  Refilled the above medications.  Will add the following medications:  Will change the following medications:  Reviewed medications and side effects in detail.  Was given samples of   Will check the above labs.  Will check labs before next visit.  Reviewed most recent labs.  Reviewed diet, exercise and weight control.  Cardiovascular risks and recommendations reviewed.  Patient encouraged to quit smoking.  Patient encouraged to take medications as prescribed.  Patient encouraged to follow a diabetic/low sodium diet.  Use of aspirin to prevent MIs and TIAs discussed.   Patient will check blood sugars once daily.    I have reviewed the patient's past medical history, social history and family history and vitals.  We have discussed treatment plan and follow up and given patient instructions.  Patient's questions are answered and we will follow up as indicated.        PATIENT INSTRUCTIONS:        Consent:  This patient and/or their healthcare decision maker is aware that this patient-initiated Telehealth encounter is a billable service, with coverage as determined by their insurance carrier. Patient is aware that they may receive a bill and has provided verbal consent to proceed: Yes     The patient is being evaluated by a Virtual Visit (video visit) encounter to address concerns as mentioned above.  A caregiver was present when appropriate. Due to this being a Scientist, research (medical) (During COVID-19 public health emergency), evaluation of the following organ systems was limited:  Vitals/Constitutional/EENT/Resp/CV/GI/GU/MS/Neuro/Skin/Heme-Lymph-Imm.  Pursuant to the emergency declaration under the [facility] Act  and the [employer], 1135 waiver authority and the Coronavirus Preparedness and [employer] Act, this Virtual Visit was conducted with patient's (and/or legal guardian's) consent, to reduce the patient's risk of exposure to COVID-19 and provide necessary medical care.  The patient (and/or legal guardian) has also been advised to contact this office for worsening conditions or problems, and seek emergency medical treatment and/or call 911 if deemed necessary.    Patient identification was verified at the start of the visit: YES    Services were provided through a video synchronous discussion virtually to substitute for in-person clinic visit. Patient and provider were located at their individual homes.    Jacqueline Simmons, was evaluated through a synchronous (real-time) audio-video encounter. The patient (or guardian if applicable) is aware that this is a billable service, which includes applicable co-pays. This Virtual Visit was conducted with patient's (and/or legal guardian's) consent. Patient identification was verified, and a caregiver was present when appropriate.   The patient was located at Home: [address]  [place] [address]  Provider was located at [employer] (Appt Dept): [address]  [place],  [place] [address]  Confirm you are appropriately licensed, registered, or certified to deliver care in the state where the patient is located as indicated above. If you are not or unsure, please re-schedule the visit: Yes, I confirm.       Total Time: minutes: 21-30 minutes.       Dictated using voice recognition software. Proofread, but unrecognized voice recognition errors may exist.    [name], MD  04/11/23

## 2023-04-16 ENCOUNTER — Encounter

## 2023-04-23 ENCOUNTER — Encounter

## 2023-04-23 MED ORDER — OXYCODONE-ACETAMINOPHEN 10-325 MG PO TABS
10-325 | ORAL_TABLET | ORAL | 0 refills | Status: DC | PRN
Start: 2023-04-23 — End: 2023-05-13

## 2023-04-23 NOTE — Telephone Encounter (Signed)
Patient needs a refill of percocet 

## 2023-05-12 ENCOUNTER — Encounter

## 2023-05-12 ENCOUNTER — Encounter: Primary: Family Medicine

## 2023-05-13 ENCOUNTER — Encounter

## 2023-05-13 LAB — LIPID PANEL
Chol/HDL Ratio: 5.1 — ABNORMAL HIGH (ref 0.0–5.0)
Cholesterol, Total: 219 mg/dL — ABNORMAL HIGH (ref 0–200)
HDL: 43 mg/dL (ref 40–60)
LDL Cholesterol: 130 mg/dL — ABNORMAL HIGH (ref 0–100)
Triglycerides: 233 mg/dL — ABNORMAL HIGH (ref 0–150)
VLDL Cholesterol Calculated: 47 mg/dL — ABNORMAL HIGH (ref 6–23)

## 2023-05-13 LAB — COMPREHENSIVE METABOLIC PANEL
ALT: 13 U/L (ref 12–65)
AST: 29 U/L (ref 15–37)
Albumin/Globulin Ratio: 1 (ref 1.0–1.9)
Albumin: 3.8 g/dL (ref 3.5–5.0)
Alk Phosphatase: 80 U/L (ref 35–104)
Anion Gap: 11 mmol/L (ref 9–18)
BUN: 12 mg/dL (ref 6–23)
CO2: 26 mmol/L (ref 20–28)
Calcium: 9.5 mg/dL (ref 8.8–10.2)
Chloride: 102 mmol/L (ref 98–107)
Creatinine: 0.81 mg/dL (ref 0.60–1.10)
Est, Glom Filt Rate: >90 mL/min/{1.73_m2} (ref >60)
Globulin: 3.8 g/dL — ABNORMAL HIGH (ref 2.3–3.5)
Glucose: 84 mg/dL (ref 70–99)
Potassium: 4.6 mmol/L (ref 3.5–5.1)
Sodium: 139 mmol/L (ref 136–145)
Total Bilirubin: <0.2 mg/dL (ref 0.0–1.2)
Total Protein: 7.6 g/dL (ref 6.3–8.2)

## 2023-05-13 LAB — URINALYSIS WITH REFLEX TO CULTURE
Bilirubin, Urine: NEGATIVE
Blood, Urine: NEGATIVE
Casts: 0 /LPF
Crystals: 0 /LPF
Glucose, Ur: NEGATIVE mg/dL
Ketones, Urine: NEGATIVE mg/dL
Leukocyte Esterase, Urine: NEGATIVE
Mucus, UA: 0 /LPF
Nitrite, Urine: NEGATIVE
Protein, UA: NEGATIVE mg/dL
Specific Gravity, UA: 1.019 (ref 1.001–1.023)
Urobilinogen, Urine: 0.2 EU/dL (ref 0.2–1.0)
pH, Urine: 6.5 (ref 5.0–9.0)

## 2023-05-13 LAB — CBC WITH AUTO DIFFERENTIAL
Basophils %: 1 % (ref 0.0–2.0)
Basophils Absolute: 0 10*3/uL (ref 0.0–0.2)
Eosinophils %: 2 % (ref 0.5–7.8)
Eosinophils Absolute: 0.1 10*3/uL (ref 0.0–0.8)
Hematocrit: 36.5 % (ref 35.8–46.3)
Hemoglobin: 10.6 g/dL — ABNORMAL LOW (ref 11.7–15.4)
Immature Granulocytes %: 1 % (ref 0.0–5.0)
Immature Granulocytes Absolute: 0 10*3/uL (ref 0.0–0.5)
Lymphocytes %: 28 % (ref 13–44)
Lymphocytes Absolute: 1.9 10*3/uL (ref 0.5–4.6)
MCH: 25.1 pg — ABNORMAL LOW (ref 26.1–32.9)
MCHC: 29 g/dL — ABNORMAL LOW (ref 31.4–35.0)
MCV: 86.5 FL (ref 82–102)
MPV: 10 FL (ref 9.4–12.3)
Monocytes %: 6 % (ref 4.0–12.0)
Monocytes Absolute: 0.4 10*3/uL (ref 0.1–1.3)
Neutrophils %: 64 % (ref 43–78)
Neutrophils Absolute: 4.4 10*3/uL (ref 1.7–8.2)
Platelets: 492 10*3/uL — ABNORMAL HIGH (ref 150–450)
RBC: 4.22 M/uL (ref 4.05–5.2)
RDW: 16.6 % — ABNORMAL HIGH (ref 11.9–14.6)
WBC: 6.9 10*3/uL (ref 4.3–11.1)
nRBC: 0 10*3/uL (ref 0.0–0.2)

## 2023-05-13 LAB — FERRITIN: Ferritin: 22 ng/mL (ref 8–388)

## 2023-05-13 LAB — VITAMIN D 25 HYDROXY: Vit D, 25-Hydroxy: 32.8 ng/mL (ref 30.0–100.0)

## 2023-05-13 LAB — IRON: Iron: 27 ug/dL — ABNORMAL LOW (ref 35–100)

## 2023-05-13 LAB — HEMOGLOBIN A1C
Estimated Avg Glucose: 122 mg/dL
Hemoglobin A1C: 5.9 % — ABNORMAL HIGH (ref 0–5.6)

## 2023-05-13 MED ORDER — PROCHLORPERAZINE 25 MG RE SUPP
25 | Freq: Two times a day (BID) | RECTAL | 5 refills | Status: DC | PRN
Start: 2023-05-13 — End: 2023-10-30

## 2023-05-13 MED ORDER — MYCOPHENOLATE MOFETIL 250 MG PO CAPS
250 | ORAL_CAPSULE | Freq: Two times a day (BID) | ORAL | 5 refills | Status: AC
Start: 2023-05-13 — End: ?

## 2023-05-13 MED ORDER — METOPROLOL SUCCINATE ER 25 MG PO TB24
25 | ORAL_TABLET | Freq: Every day | ORAL | 5 refills | Status: DC
Start: 2023-05-13 — End: 2023-06-06

## 2023-05-13 MED ORDER — PANTOPRAZOLE SODIUM 40 MG PO TBEC
40 | ORAL_TABLET | Freq: Every day | ORAL | 5 refills | 43.00000 days | Status: DC
Start: 2023-05-13 — End: 2023-12-30

## 2023-05-13 MED ORDER — VITAMIN D (ERGOCALCIFEROL) 1.25 MG (50000 UT) PO CAPS
1.25 | ORAL_CAPSULE | ORAL | 3 refills | Status: DC
Start: 2023-05-13 — End: 2024-06-06

## 2023-05-13 MED ORDER — CETIRIZINE HCL 10 MG PO TABS
10 | ORAL_TABLET | Freq: Every day | ORAL | 5 refills | Status: AC
Start: 2023-05-13 — End: ?

## 2023-05-13 MED ORDER — TRIAMCINOLONE ACETONIDE 0.1 % EX CREA
0.1 | CUTANEOUS | 5 refills | Status: AC
Start: 2023-05-13 — End: ?

## 2023-05-13 MED ORDER — OXYCODONE HCL 20 MG PO TABS
20 | ORAL_TABLET | ORAL | 0 refills | Status: DC | PRN
Start: 2023-05-13 — End: 2023-06-06

## 2023-05-13 NOTE — Telephone Encounter (Signed)
 Patient's mother states that the Percocet is not holding her breakthrough pain. She wants to know if we can change it to Oxy IR 20 mg every 4 hours as needed. Also, she wakes up throwing up. She has Zofran  but sometimes it does not work. She is asking for compazine  suppositories. She is also having rashes just from touching items. She also needs refills.    Will send Oxy IR 20 mg q4h prn, Zyrtec  10 mg daily, Triamcinolone  cream, compazine  suppositories and her other refills to the pharmacy.    Vitals:  BP: 98/72  O2: 98%  Wt: 196.4 lbs  Temp: 98.1 F

## 2023-06-02 ENCOUNTER — Telehealth

## 2023-06-02 MED ORDER — HYDROMORPHONE HCL 3 MG RE SUPP
3 | Freq: Four times a day (QID) | RECTAL | 0 refills | Status: DC | PRN
Start: 2023-06-02 — End: 2023-07-02

## 2023-06-02 NOTE — Telephone Encounter (Signed)
Blood pressure readings:   179/118, pulse 99  174/117, pulse 93  165/120, pulse 105  160/107, pulse 103  175/104, pulse 100  167/119, pulse 89  159/120, pulse 111  217/174 - was the highest. Her heart rate has been as high as 115-180    Patient is in a pain crisis. Her blood pressure and pulse rate is elevating.  Her mother increased the oxycodone to 40 mg but it did not help.     Will add Dilaudid suppositories until her pain crisis has subsided.

## 2023-06-03 NOTE — Telephone Encounter (Signed)
Received mychart message that patient wanted to schedule an appointment with Dr. Karie Mainland due to dizziness and / or weakness.  Patient no showed last appointments in 2023    Called patient to schedule, LVM instructing patient to call back

## 2023-06-06 ENCOUNTER — Encounter

## 2023-06-06 MED ORDER — OXYCODONE HCL 30 MG PO TABS
30 | ORAL_TABLET | ORAL | 0 refills | Status: DC | PRN
Start: 2023-06-06 — End: 2023-07-04

## 2023-06-06 MED ORDER — FENTANYL 100 MCG/HR TD PT72
100 | MEDICATED_PATCH | TRANSDERMAL | 0 refills | Status: DC
Start: 2023-06-06 — End: 2023-07-21

## 2023-06-06 MED ORDER — DIAZEPAM 10 MG PO TABS
10 | ORAL_TABLET | ORAL | 3 refills | Status: DC | PRN
Start: 2023-06-06 — End: 2023-06-23

## 2023-06-06 NOTE — Telephone Encounter (Signed)
Patient is in a lot of pain. Her blood pressure is elevated at 185/136 and pulse is 119. She is in a pain crisis. The dilaudid suppositories are on back order. Her mother is a Charity fundraiser and is asking if we can increase the Fentanyl to 100 mcg, and increase the oxycodone to 60 mg. She also needs a refill on the Tizanidine.

## 2023-06-06 NOTE — Addendum Note (Signed)
Addended by: Amada Kingfisher F on: 06/06/2023 02:54 PM     Modules accepted: Orders

## 2023-06-12 ENCOUNTER — Encounter: Payer: PRIVATE HEALTH INSURANCE | Attending: Family | Primary: Family Medicine

## 2023-06-23 ENCOUNTER — Encounter

## 2023-06-24 ENCOUNTER — Encounter

## 2023-06-24 MED ORDER — LORAZEPAM 2 MG PO TABS
2 MG | ORAL_TABLET | Freq: Four times a day (QID) | ORAL | 3 refills | Status: DC | PRN
Start: 2023-06-24 — End: 2023-07-04

## 2023-06-24 MED ORDER — DIAZEPAM 10 MG PO TABS
10 MG | ORAL_TABLET | ORAL | 3 refills | Status: AC | PRN
Start: 2023-06-24 — End: 2023-07-24

## 2023-06-24 NOTE — Telephone Encounter (Signed)
Patient needs a refill on the Lorazepam.

## 2023-06-25 ENCOUNTER — Encounter

## 2023-06-25 NOTE — Telephone Encounter (Signed)
Patient needs a refill of valium.

## 2023-06-26 ENCOUNTER — Encounter

## 2023-06-26 MED ORDER — DIAZEPAM 10 MG PO TABS
10 MG | ORAL_TABLET | ORAL | 3 refills | Status: DC
Start: 2023-06-26 — End: 2023-07-04

## 2023-06-26 NOTE — Telephone Encounter (Signed)
Patient's mother called and stated that the pharmacy would only give her 15 days of the Valium because it said "up to 30 days" on the prescription. She is asking for the RX to be changed.

## 2023-06-30 ENCOUNTER — Encounter

## 2023-06-30 ENCOUNTER — Ambulatory Visit
Admit: 2023-06-30 | Discharge: 2023-07-01 | Payer: PRIVATE HEALTH INSURANCE | Attending: Family Medicine | Primary: Family Medicine

## 2023-06-30 DIAGNOSIS — M329 Systemic lupus erythematosus, unspecified: Secondary | ICD-10-CM

## 2023-06-30 LAB — COMPREHENSIVE METABOLIC PANEL
ALT: 16 U/L (ref 8–45)
AST: 23 U/L (ref 15–37)
Albumin/Globulin Ratio: 0.9 — ABNORMAL LOW (ref 1.0–1.9)
Albumin: 3.3 g/dL — ABNORMAL LOW (ref 3.5–5.0)
Alk Phosphatase: 107 U/L — ABNORMAL HIGH (ref 35–104)
Anion Gap: 12 mmol/L (ref 7–16)
BUN: 11 mg/dL (ref 6–23)
CO2: 26 mmol/L (ref 20–29)
Calcium: 9.7 mg/dL (ref 8.8–10.2)
Chloride: 102 mmol/L (ref 98–107)
Creatinine: 1.06 mg/dL (ref 0.60–1.10)
Est, Glom Filt Rate: 67 mL/min/{1.73_m2} (ref >60)
Globulin: 3.7 g/dL — ABNORMAL HIGH (ref 2.3–3.5)
Glucose: 95 mg/dL (ref 70–99)
Potassium: 4.8 mmol/L (ref 3.5–5.1)
Sodium: 140 mmol/L (ref 136–145)
Total Bilirubin: <0.2 mg/dL (ref 0.0–1.2)
Total Protein: 7 g/dL (ref 6.3–8.2)

## 2023-06-30 LAB — LIPID PANEL
Chol/HDL Ratio: 5.6 — ABNORMAL HIGH (ref 0.0–5.0)
Cholesterol, Total: 215 mg/dL — ABNORMAL HIGH (ref 0–200)
HDL: 39 mg/dL — ABNORMAL LOW (ref 40–60)
LDL Cholesterol: 129 mg/dL — ABNORMAL HIGH (ref 0–100)
Triglycerides: 238 mg/dL — ABNORMAL HIGH (ref 0–150)
VLDL Cholesterol Calculated: 48 mg/dL — ABNORMAL HIGH (ref 6–23)

## 2023-06-30 LAB — T4, FREE: T4 Free: 0.9 ng/dL (ref 0.9–1.7)

## 2023-06-30 LAB — VITAMIN D 25 HYDROXY: Vit D, 25-Hydroxy: 32.4 ng/mL (ref 30.0–100.0)

## 2023-06-30 LAB — TSH: TSH, 3rd Generation: 1.66 u[IU]/mL (ref 0.270–4.200)

## 2023-06-30 MED ORDER — TRULANCE 3 MG PO TABS
3 MG | ORAL_TABLET | Freq: Every day | ORAL | 5 refills | Status: DC
Start: 2023-06-30 — End: 2023-10-17

## 2023-06-30 MED ORDER — SILDENAFIL CITRATE 20 MG PO TABS
20 MG | ORAL_TABLET | Freq: Three times a day (TID) | ORAL | 5 refills | Status: DC
Start: 2023-06-30 — End: 2023-10-17

## 2023-06-30 MED ORDER — TRULANCE 3 MG PO TABS
3 | ORAL_TABLET | Freq: Every day | ORAL | 0 refills | Status: AC
Start: 2023-06-30 — End: ?

## 2023-06-30 MED ORDER — IBSRELA 50 MG PO TABS
50 MG | ORAL_TABLET | Freq: Every day | ORAL | 0 refills | Status: DC
Start: 2023-06-30 — End: 2023-10-17

## 2023-06-30 NOTE — Progress Notes (Signed)
[facility]  [name], M.D.  [address]  [place], [place] [address]  Phone:  [phone]  Fax:  [phone]    CHIEF COMPLAINT:  Chief Complaint   Patient presents with    Pain     Pt states she has been having increased pain in legs for several months. She is taking fentanyl patch and oxy, states it doesn't really help.     Rash     Pt states everytime she touches something she will break out into a rash. She has changed all soaps to natural.     Constipation     Pt states she has been having constipation issues. She will go a few days and then have a very small bowel movement. She has tried Doculax suppositories and tablets and smooth move tea.     Weight Loss     Pt has been dropping weight over the past few months she would like to discuss. She is concerned.         HISTORY OF PRESENT ILLNESS:  Jacqueline Simmons is a 42 y.o. female  who presents for follow up. Her mother is present and helps to give her history. Patient states she has been having increased pain in legs for several months. She is using a fentanyl patch and taking oxycodone but, it doesn't really help. Her pain is worse at night and she wakes up screaming. She has a history of lupus arthritis. She also had to have extensive surgery on her left lower leg that has caused neuropathic pain.    She also has been having episodes where her fingertips turn white. They will ache and hurt when this happens. She has a picture on her phone from when her fingertips had turned white.    She also has been breaking out recently. She touches something and then breaks out into tiny red bumps. The rash can be itchy. It can be found on her face or arms.     Patient states she has been having constipation issues. She will go a few days and then have a very small bowel movement. She has tried Doculax suppositories and tablets and Smooth Move tea. She denies any blood in her stools or black stools.     Her mother is concerned because her appetite  has decreased. When she is hurting, she does not want to eat. The pain medication makes her drowsy. She has been losing weight. She has lost 18 lbs since January. She has lost 40 lbs since last August.    No other complaints. Taking medications as prescribed. Medications reviewed and updated.     HISTORY:  Allergies   Allergen Reactions    Aspirin Hives, Itching, Rash, Shortness Of Breath and Swelling    Levaquin [Levofloxacin] Anaphylaxis    Bactrim [Sulfamethoxazole-Trimethoprim] Angioedema     Eyes swell    Nsaids Hives and Angioedema     Tongue swells       REVIEW OF SYSTEMS:  Review of systems is as indicated in HPI otherwise negative.    PHYSICAL EXAM:  Visit Vitals  BP (!) 97/50 (Site: Left Upper Arm, Position: Sitting)   Pulse 73   Temp 97.3 F (36.3 C) (Temporal)   Ht 1.549 m (5\' 1" )   Wt 94.8 kg (209 lb)   SpO2 97%   BMI 39.49 kg/m        Physical Exam  Vitals and nursing note reviewed.   Constitutional:  Appearance: Normal appearance. She is obese.   HENT:      Head: Normocephalic and atraumatic.      Nose: Nose normal.      Mouth/Throat:      Mouth: Mucous membranes are moist.   Eyes:      Extraocular Movements: Extraocular movements intact.      Pupils: Pupils are equal, round, and reactive to light.   Cardiovascular:      Rate and Rhythm: Normal rate and regular rhythm.      Pulses: Normal pulses.   Pulmonary:      Effort: Pulmonary effort is normal.      Breath sounds: Normal breath sounds.   Abdominal:      General: Abdomen is flat.      Palpations: Abdomen is soft.   Musculoskeletal:         General: Normal range of motion.      Cervical back: Normal range of motion and neck supple. Tenderness present.      Thoracic back: Spasms and tenderness present.      Lumbar back: Spasms and tenderness present.        Back:         Legs:       Comments: Evidence of skin grafting on the LLE. Moderate swelling in the left lower leg and foot.      Skin:     General: Skin is warm and dry.      Findings:  Rash present.      Comments: Rash on    Neurological:      Mental Status: She is alert and oriented to person, place, and time.      Cranial Nerves: Cranial nerves 2-12 are intact.      Sensory: Sensation is intact.      Motor: Weakness present.      Coordination: Coordination abnormal.      Gait: Gait abnormal.   Psychiatric:         Mood and Affect: Mood normal.         Behavior: Behavior normal.         PHQ:      09/12/2022     9:45 AM   PHQ-9    Little interest or pleasure in doing things 0   Feeling down, depressed, or hopeless 0   PHQ-2 Score 0   PHQ-9 Total Score 0       LABS  No results found for this visit on 06/30/23.  Orders Only on 05/12/2023   Component Date Value Ref Range Status    Ferritin 05/12/2023 22  8 - 388 NG/ML Final    Iron 05/12/2023 27 (L)  35 - 100 ug/dL Final    Color, UA 23/55/7322 YELLOW/STRAW    Final    Color Reference Range: Straw, Yellow or Dark Yellow    Appearance 05/12/2023 CLEAR    Final    Specific Gravity, UA 05/12/2023 1.019  1.001 - 1.023   Final    pH, Urine 05/12/2023 6.5  5.0 - 9.0   Final    Protein, UA 05/12/2023 Negative  Negative mg/dL Final    Glucose, Ur 02/54/2706 Negative  mg/dL Final    Ketones, Urine 05/12/2023 Negative  Negative mg/dL Final    Bilirubin, Urine 05/12/2023 Negative  Negative   Final    Blood, Urine 05/12/2023 Negative  Negative   Final    Urobilinogen, Urine 05/12/2023 0.2  0.2 - 1.0 EU/dL Final    Nitrite, Urine  05/12/2023 Negative  Negative   Final    Leukocyte Esterase, Urine 05/12/2023 Negative  Negative   Final    Urine Culture if Indicated 05/12/2023 CULTURE NOT INDICATED BY UA RESULT    Final    WBC, UA 05/12/2023 0-4  0 - 4 /hpf Final    RBC, UA 05/12/2023 0-5  0 - 5 /hpf Final    BACTERIA, URINE 05/12/2023 1+ (A)  Negative /hpf Final    Epithelial Cells, UA 05/12/2023 5-10 (A)  0 - 5 /hpf Final    Hyaline Casts, UA 05/12/2023 0-2  /lpf Final    Casts 05/12/2023 0  0 /lpf Final    Crystals 05/12/2023 0  0 /LPF Final    Mucus, UA 05/12/2023  0  0 /lpf Final    Vit D, 25-Hydroxy 05/12/2023 32.8  30.0 - 100.0 ng/mL Final    Comment: Deficiency         <20.0 ng/mL  Insufficiency       20.0-29.9 ng/mL      Hemoglobin A1C 05/12/2023 5.9 (H)  0 - 5.6 % Final    Comment: Reference Range  Normal       <5.7%  Prediabetes  5.7-6.4%  Diabetes     >6.4%      Estimated Avg Glucose 05/12/2023 122  mg/dL Final    Cholesterol, Total 05/12/2023 219 (H)  0 - 200 MG/DL Final    Comment: Borderline High: 200-239 mg/dL  High: Greater than or equal to 240 mg/dL      Triglycerides 16/05/9603 233 (H)  0 - 150 MG/DL Final    Comment: Borderline High: 150-199 mg/dL, High: 540-981 mg/dL  Very High: Greater than or equal to 500 mg/dL      HDL 19/14/7829 43  40 - 60 MG/DL Final    LDL Cholesterol 05/12/2023 130 (H)  0 - 100 MG/DL Final    Comment: Near Optimal: 100-129 mg/dL  Borderline High: 562-130, High: 160-189 mg/dL  Very High: Greater than or equal to 190 mg/dL      VLDL Cholesterol Calculated 05/12/2023 47 (H)  6 - 23 MG/DL Final    Chol/HDL Ratio 05/12/2023 5.1 (H)  0.0 - 5.0   Final    Sodium 05/12/2023 139  136 - 145 mmol/L Final    Potassium 05/12/2023 4.6  3.5 - 5.1 mmol/L Final    Chloride 05/12/2023 102  98 - 107 mmol/L Final    CO2 05/12/2023 26  20 - 28 mmol/L Final    Anion Gap 05/12/2023 11  9 - 18 mmol/L Final    Glucose 05/12/2023 84  70 - 99 mg/dL Final    Comment: <86 mg/dL Consistent with, but not fully diagnostic of hypoglycemia.  100 - 125 mg/dL Impaired fasting glucose/consistent with pre-diabetes mellitus.  > 126 mg/dl Fasting glucose consistent with overt diabetes mellitus      BUN 05/12/2023 12  6 - 23 MG/DL Final    Creatinine 57/84/6962 0.81  0.60 - 1.10 MG/DL Final    Est, Glom Filt Rate 05/12/2023 >90  >60 ml/min/1.58m2 Final    Comment:   Pediatric calculator link: https://www.kidney.org/professionals/kdoqi/gfr_calculatorped    These results are not intended for use in patients <75 years of age.    eGFR results are calculated without a race factor  using  the 2021 CKD-EPI equation. Careful clinical correlation is recommended, particularly when comparing to results calculated using previous equations.  The CKD-EPI equation is less accurate in patients with extremes of muscle mass,  extra-renal metabolism of creatinine, excessive creatine ingestion, or following therapy that affects renal tubular secretion.      Calcium 05/12/2023 9.5  8.8 - 10.2 MG/DL Final    Total Bilirubin 05/12/2023 <0.2  0.0 - 1.2 MG/DL Final    ALT 13/03/6577 13  12 - 65 U/L Final    AST 05/12/2023 29  15 - 37 U/L Final    Alk Phosphatase 05/12/2023 80  35 - 104 U/L Final    Total Protein 05/12/2023 7.6  6.3 - 8.2 g/dL Final    Albumin 46/96/2952 3.8  3.5 - 5.0 g/dL Final    Globulin 84/13/2440 3.8 (H)  2.3 - 3.5 g/dL Final    Albumin/Globulin Ratio 05/12/2023 1.0  1.0 - 1.9   Final    WBC 05/12/2023 6.9  4.3 - 11.1 K/uL Final    RBC 05/12/2023 4.22  4.05 - 5.2 M/uL Final    Hemoglobin 05/12/2023 10.6 (L)  11.7 - 15.4 g/dL Final    Hematocrit 06/22/2535 36.5  35.8 - 46.3 % Final    MCV 05/12/2023 86.5  82 - 102 FL Final    MCH 05/12/2023 25.1 (L)  26.1 - 32.9 PG Final    MCHC 05/12/2023 29.0 (L)  31.4 - 35.0 g/dL Final    RDW 64/40/3474 16.6 (H)  11.9 - 14.6 % Final    Platelets 05/12/2023 492 (H)  150 - 450 K/uL Final    MPV 05/12/2023 10.0  9.4 - 12.3 FL Final    nRBC 05/12/2023 0.00  0.0 - 0.2 K/uL Final    **Note: Absolute NRBC parameter is now reported with Hemogram**    Differential Type 05/12/2023 AUTOMATED    Final    Neutrophils % 05/12/2023 64  43 - 78 % Final    Lymphocytes % 05/12/2023 28  13 - 44 % Final    Monocytes % 05/12/2023 6  4.0 - 12.0 % Final    Eosinophils % 05/12/2023 2  0.5 - 7.8 % Final    Basophils % 05/12/2023 1  0.0 - 2.0 % Final    Immature Granulocytes % 05/12/2023 1  0.0 - 5.0 % Final    Neutrophils Absolute 05/12/2023 4.4  1.7 - 8.2 K/UL Final    Lymphocytes Absolute 05/12/2023 1.9  0.5 - 4.6 K/UL Final    Monocytes Absolute 05/12/2023 0.4  0.1 - 1.3 K/UL  Final    Eosinophils Absolute 05/12/2023 0.1  0.0 - 0.8 K/UL Final    Basophils Absolute 05/12/2023 0.0  0.0 - 0.2 K/UL Final    Immature Granulocytes Absolute 05/12/2023 0.0  0.0 - 0.5 K/UL Final       IMPRESSION/PLAN     Diagnosis Orders   1. Lupus arthritis (HCC)        2. Raynaud's disease without gangrene  sildenafil (REVATIO) 20 MG tablet      3. Muscle spasm        4. Enlarged thyroid  T4, Free    TSH      5. Chronic fatigue        6. Other chronic pain        7. Hyperglycemia  Hemoglobin A1C      8. Chronic idiopathic constipation  Plecanatide (TRULANCE) 3 MG TABS    Tenapanor HCl (IBSRELA) 50 MG TABS    Plecanatide (TRULANCE) 3 MG TABS      9. Weight loss        10. Vitamin D deficiency  Vitamin D 25 Hydroxy  11. Screening for lipid disorders  Lipid Panel      12. Encounter for long-term (current) use of medications  Comprehensive Metabolic Panel      13. Flu vaccine need  Influenza, FLUCELVAX Trivalent, (age 70 mo+) IM, Preservative Free, 0.26mL          Follow up and Dispositions:  Return in about 4 months (around 10/28/2023).     Patient will continue current medications.  Refilled the above medications.  Will add the following medications: Sildenafil 20 mg as needed for Raynaud' disease. This may help the pain in her fingertips when she has a reaction.  Will change the following medications:  Reviewed medications and side effects in detail.  Was given samples of Trulance 3 mg daily and Ibsrella 50 mg daily.  Will check the above labs.  Reviewed most recent labs.  Reviewed diet, exercise and weight control.  Cardiovascular risks and recommendations reviewed.  Patient encouraged to follow a low sodium diet.  Use of aspirin to prevent MIs and TIAs discussed.   She was given the flu vaccine IM x 1.    I have reviewed the patient's past medical history, social history and family history and vitals.  We have discussed treatment plan and follow up and given patient instructions.  Patient's questions are  answered and we will follow up as indicated.    Her mother would like for her to get iron infusions and consider a pain pump.    [name], MD    Dictated using voice recognition software. Proofread, but unrecognized voice recognition errors may exist.

## 2023-06-30 NOTE — Progress Notes (Incomplete)
[facility]  [name], M.D.  [address]  [place], [place] [address]  Phone:  [phone]  Fax:  [phone]    CHIEF COMPLAINT:  Chief Complaint   Patient presents with   . Pain     Pt states she has been having increased pain in legs for several months. She is taking fentanyl patch and oxy, states it doesn't really help.    . Rash     Pt states everytime she touches something she will break out into a rash. She has changed all soaps to natural.    . Constipation     Pt states she has been constipation issues. She will go a few days and then have a very small bowel movement. She has tried Doculax suppositories and tablets and smooth move tea.    . Weight Loss     Pt has been dropping weight over the past few months she would like to discuss. She is concerned.         HISTORY OF PRESENT ILLNESS:  Ms. Lemus is a 42 y.o. female  who presents for follow up. The patient, Jacqueline Simmons, reports that the medication is helping with her pain, but the pharmacy was unable to provide Dilaudid suppositories, leading to the consideration of a pain pump for long-term management. She mentions that the cream helps with her rashes but does not prevent them from appearing.    Saraia expresses concern about her weight loss, which she attributes to a lack of appetite, possibly due to pain. She also experiences vomiting if she takes medication too soon after eating. She has lost 18 lbs since January, going from 227 lbs to 209 lbs.    The patient requires iron infusions, but a flash on the port could not be obtained during the visit. Her hemoglobin was low at her last check, around 3.1. Kirat has tried Smooth Move Tea, Docalax suppositories, and an enema for constipation, but they provided minimal relief. She is unable to take maxcitrate or milk of magnesia due to vomiting.    Deana reports that her rash sometimes itches and sometimes does not. 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 She also experiences episodes where her fingers turn white, which started recently.    The patient denies any current chest pain or shortness of breath. She has a history of leg surgery and reports pain in that leg. Delicia's blood pressure has been getting lower, and she has not taken her medication yet. Her pulse is normal.    The patient reports that her rash can appear suddenly, even during the visit. She mentions having a butterfly rash associated with her lupus. Kynlie also notes swelling in her neck and ears. She states that when her fingers turn white, it is painful. The patient expresses feeling overwhelmed by her medical issues and missing family gatherings due to her condition.    HISTORY:  Allergies   Allergen Reactions   . Aspirin Hives, Itching, Rash, Shortness Of Breath and Swelling   . Levaquin [Levofloxacin] Anaphylaxis   . Bactrim [Sulfamethoxazole-Trimethoprim] Angioedema     Eyes swell   . Nsaids Hives and Angioedema     Tongue swells       REVIEW OF SYSTEMS:  Review of systems is as indicated in HPI otherwise negative.    PHYSICAL EXAM:  Visit Vitals  BP (!) 97/50 (Site: Left Upper Arm, Position: Sitting)  Pulse 73   Temp 97.3 F (36.3 C) (Temporal)   Ht 1.549 m (5\' 1" )   Wt 94.8 kg (209 lb)   SpO2 97%   BMI 39.49 kg/m        Physical Exam  Vitals and nursing note reviewed.   Constitutional:       Appearance: Normal appearance. She is obese.   HENT:      Head: Normocephalic and atraumatic.      Nose: Nose normal.      Mouth/Throat:      Mouth: Mucous membranes are moist.   Eyes:      Extraocular Movements: Extraocular movements intact.      Pupils: Pupils are equal, round, and reactive to light.   Cardiovascular:      Rate and Rhythm: Normal rate and regular rhythm.      Pulses: Normal pulses.   Pulmonary:      Effort: Pulmonary effort is normal.      Breath sounds: Normal breath sounds.   Abdominal:      General: Abdomen is flat.      Palpations: Abdomen is soft.    Musculoskeletal:         General: Normal range of motion.      Cervical back: Normal range of motion and neck supple. Tenderness present.      Thoracic back: Spasms and tenderness present.      Lumbar back: Spasms and tenderness present.        Back:         Legs:       Comments: Evidence of skin grafting on the LLE. Moderate swelling in the left lower leg and foot.      Skin:     General: Skin is warm and dry.      Findings: Rash present.      Comments: Rash on    Neurological:      Mental Status: She is alert and oriented to person, place, and time.      Cranial Nerves: Cranial nerves 2-12 are intact.      Sensory: Sensation is intact.      Motor: Weakness present.      Coordination: Coordination abnormal.      Gait: Gait abnormal.      Comments: She is walker with crutches. She has a wheelchair at home.   Psychiatric:         Mood and Affect: Mood normal.         Behavior: Behavior normal.         PHQ:      09/12/2022     9:45 AM   PHQ-9    Little interest or pleasure in doing things 0   Feeling down, depressed, or hopeless 0   PHQ-2 Score 0   PHQ-9 Total Score 0       LABS  No results found for this visit on 06/30/23.  Orders Only on 05/12/2023   Component Date Value Ref Range Status   . Ferritin 05/12/2023 22  8 - 388 NG/ML Final   . Iron 05/12/2023 27 (L)  35 - 100 ug/dL Final   . Color, UA 91/47/8295 YELLOW/STRAW    Final    Color Reference Range: Straw, Yellow or Dark Yellow   . Appearance 05/12/2023 CLEAR    Final   . Specific Gravity, UA 05/12/2023 1.019  1.001 - 1.023   Final   . pH, Urine 05/12/2023 6.5  5.0 - 9.0   Final   .  Protein, UA 05/12/2023 Negative  Negative mg/dL Final   . Glucose, Ur 16/05/9603 Negative  mg/dL Final   . Ketones, Urine 05/12/2023 Negative  Negative mg/dL Final   . Bilirubin, Urine 05/12/2023 Negative  Negative   Final   . Blood, Urine 05/12/2023 Negative  Negative   Final   . Urobilinogen, Urine 05/12/2023 0.2  0.2 - 1.0 EU/dL Final   . Nitrite, Urine 05/12/2023 Negative   Negative   Final   . Leukocyte Esterase, Urine 05/12/2023 Negative  Negative   Final   . Urine Culture if Indicated 05/12/2023 CULTURE NOT INDICATED BY UA RESULT    Final   . WBC, UA 05/12/2023 0-4  0 - 4 /hpf Final   . RBC, UA 05/12/2023 0-5  0 - 5 /hpf Final   . BACTERIA, URINE 05/12/2023 1+ (A)  Negative /hpf Final   . Epithelial Cells, UA 05/12/2023 5-10 (A)  0 - 5 /hpf Final   . Hyaline Casts, UA 05/12/2023 0-2  /lpf Final   . Casts 05/12/2023 0  0 /lpf Final   . Crystals 05/12/2023 0  0 /LPF Final   . Mucus, UA 05/12/2023 0  0 /lpf Final   . Vit D, 25-Hydroxy 05/12/2023 32.8  30.0 - 100.0 ng/mL Final    Comment: Deficiency         <20.0 ng/mL  Insufficiency       20.0-29.9 ng/mL     . Hemoglobin A1C 05/12/2023 5.9 (H)  0 - 5.6 % Final    Comment: Reference Range  Normal       <5.7%  Prediabetes  5.7-6.4%  Diabetes     >6.4%     . Estimated Avg Glucose 05/12/2023 122  mg/dL Final   . Cholesterol, Total 05/12/2023 219 (H)  0 - 200 MG/DL Final    Comment: Borderline High: 200-239 mg/dL  High: Greater than or equal to 240 mg/dL     . Triglycerides 05/12/2023 233 (H)  0 - 150 MG/DL Final    Comment: Borderline High: 150-199 mg/dL, High: 540-981 mg/dL  Very High: Greater than or equal to 500 mg/dL     . HDL 05/12/2023 43  40 - 60 MG/DL Final   . LDL Cholesterol 05/12/2023 130 (H)  0 - 100 MG/DL Final    Comment: Near Optimal: 100-129 mg/dL  Borderline High: 191-478, High: 160-189 mg/dL  Very High: Greater than or equal to 190 mg/dL     . VLDL Cholesterol Calculated 05/12/2023 47 (H)  6 - 23 MG/DL Final   . Chol/HDL Ratio 05/12/2023 5.1 (H)  0.0 - 5.0   Final   . Sodium 05/12/2023 139  136 - 145 mmol/L Final   . Potassium 05/12/2023 4.6  3.5 - 5.1 mmol/L Final   . Chloride 05/12/2023 102  98 - 107 mmol/L Final   . CO2 05/12/2023 26  20 - 28 mmol/L Final   . Anion Gap 05/12/2023 11  9 - 18 mmol/L Final   . Glucose 05/12/2023 84  70 - 99 mg/dL Final    Comment: <29 mg/dL Consistent with, but not fully diagnostic of  hypoglycemia.  100 - 125 mg/dL Impaired fasting glucose/consistent with pre-diabetes mellitus.  > 126 mg/dl Fasting glucose consistent with overt diabetes mellitus     . BUN 05/12/2023 12  6 - 23 MG/DL Final   . Creatinine 56/21/3086 0.81  0.60 - 1.10 MG/DL Final   . Est, Glom Filt Rate 05/12/2023 >90  >60 ml/min/1.60m2 Final  Comment:   Pediatric calculator link: https://www.kidney.org/professionals/kdoqi/gfr_calculatorped    These results are not intended for use in patients <64 years of age.    eGFR results are calculated without a race factor using  the 2021 CKD-EPI equation. Careful clinical correlation is recommended, particularly when comparing to results calculated using previous equations.  The CKD-EPI equation is less accurate in patients with extremes of muscle mass, extra-renal metabolism of creatinine, excessive creatine ingestion, or following therapy that affects renal tubular secretion.     . Calcium 05/12/2023 9.5  8.8 - 10.2 MG/DL Final   . Total Bilirubin 05/12/2023 <0.2  0.0 - 1.2 MG/DL Final   . ALT 16/05/9603 13  12 - 65 U/L Final   . AST 05/12/2023 29  15 - 37 U/L Final   . Alk Phosphatase 05/12/2023 80  35 - 104 U/L Final   . Total Protein 05/12/2023 7.6  6.3 - 8.2 g/dL Final   . Albumin 54/04/8118 3.8  3.5 - 5.0 g/dL Final   . Globulin 14/78/2956 3.8 (H)  2.3 - 3.5 g/dL Final   . Albumin/Globulin Ratio 05/12/2023 1.0  1.0 - 1.9   Final   . WBC 05/12/2023 6.9  4.3 - 11.1 K/uL Final   . RBC 05/12/2023 4.22  4.05 - 5.2 M/uL Final   . Hemoglobin 05/12/2023 10.6 (L)  11.7 - 15.4 g/dL Final   . Hematocrit 21/30/8657 36.5  35.8 - 46.3 % Final   . MCV 05/12/2023 86.5  82 - 102 FL Final   . MCH 05/12/2023 25.1 (L)  26.1 - 32.9 PG Final   . MCHC 05/12/2023 29.0 (L)  31.4 - 35.0 g/dL Final   . RDW 84/69/6295 16.6 (H)  11.9 - 14.6 % Final   . Platelets 05/12/2023 492 (H)  150 - 450 K/uL Final   . MPV 05/12/2023 10.0  9.4 - 12.3 FL Final   . nRBC 05/12/2023 0.00  0.0 - 0.2 K/uL Final    **Note:  Absolute NRBC parameter is now reported with Hemogram**   . Differential Type 05/12/2023 AUTOMATED    Final   . Neutrophils % 05/12/2023 64  43 - 78 % Final   . Lymphocytes % 05/12/2023 28  13 - 44 % Final   . Monocytes % 05/12/2023 6  4.0 - 12.0 % Final   . Eosinophils % 05/12/2023 2  0.5 - 7.8 % Final   . Basophils % 05/12/2023 1  0.0 - 2.0 % Final   . Immature Granulocytes % 05/12/2023 1  0.0 - 5.0 % Final   . Neutrophils Absolute 05/12/2023 4.4  1.7 - 8.2 K/UL Final   . Lymphocytes Absolute 05/12/2023 1.9  0.5 - 4.6 K/UL Final   . Monocytes Absolute 05/12/2023 0.4  0.1 - 1.3 K/UL Final   . Eosinophils Absolute 05/12/2023 0.1  0.0 - 0.8 K/UL Final   . Basophils Absolute 05/12/2023 0.0  0.0 - 0.2 K/UL Final   . Immature Granulocytes Absolute 05/12/2023 0.0  0.0 - 0.5 K/UL Final       IMPRESSION/PLAN     Diagnosis Orders   1. Lupus arthritis (HCC)        2. Raynaud's disease without gangrene  sildenafil (REVATIO) 20 MG tablet      3. Muscle spasm        4. Enlarged thyroid  T4, Free    TSH      5. Chronic fatigue        6. Other chronic pain  7. Hyperglycemia  Hemoglobin A1C      8. Chronic idiopathic constipation  Plecanatide (TRULANCE) 3 MG TABS    Tenapanor HCl (IBSRELA) 50 MG TABS    Plecanatide (TRULANCE) 3 MG TABS      9. Weight loss        10. Vitamin D deficiency  Vitamin D 25 Hydroxy      11. Screening for lipid disorders  Lipid Panel      12. Encounter for long-term (current) use of medications  CBC with Auto Differential    Comprehensive Metabolic Panel          Follow up and Dispositions:  Return in about 4 months (around 10/28/2023).     Patient is stable on medications and will continue current medications.  Refilled the above medications.  Will add the following medications:  Will change the following medications:  Reviewed medications and side effects in detail.  Was given samples of   Will check the above labs.  Will check labs before next visit.  Reviewed most recent labs.  Reviewed diet,  exercise and weight control.  Cardiovascular risks and recommendations reviewed.  Patient encouraged to quit smoking.  Patient encouraged to take medications as prescribed.  Patient encouraged to follow a diabetic/low sodium diet.  Use of aspirin to prevent MIs and TIAs discussed.   Patient will check blood sugars once daily.    I have reviewed the patient's past medical history, social history and family history and vitals.  We have discussed treatment plan and follow up and given patient instructions.  Patient's questions are answered and we will follow up as indicated.        PATIENT INSTRUCTIONS:        [name], MD    Dictated using voice recognition software. Proofread, but unrecognized voice recognition errors may exist.

## 2023-07-02 ENCOUNTER — Encounter

## 2023-07-02 ENCOUNTER — Telehealth

## 2023-07-02 NOTE — Telephone Encounter (Signed)
-----   Message from [facility] [place], [place] sent at 07/01/2023 11:53 AM EST -----  Regarding: FW: Lab recollect    ----- Message -----  From: Tomasa Rand  Sent: 07/01/2023  11:44 AM EST  To: [facility]  Subject: Lab recollect                                    The following lab test(s) were not completed.    Lab tests:  CBC    Recollect reason: Quantity insufficient. The sample submitted was a short draw. Test could not be performed.    Please consult with the ordering physician/APC if the tests are needed.    If this test requires recollection, please re-enter order in Epic within 24 hours of this notice and respond to this message as Client Services will contact the patient.     If this test DOES NOT require recollection, document this in Epic documentation only encounter.    If you need additional information, respond to this message and/or call [phone].    [facility] Client Services

## 2023-07-02 NOTE — Telephone Encounter (Signed)
Spoke with patient, she will go to lab to have blood re-collected. Order placed in chart per Dr Evette Georges order.

## 2023-07-04 ENCOUNTER — Encounter

## 2023-07-04 MED ORDER — OXYCODONE HCL 30 MG PO TABS
30 MG | ORAL_TABLET | ORAL | 0 refills | Status: AC | PRN
Start: 2023-07-04 — End: 2023-08-03

## 2023-07-04 MED ORDER — DIPHENHYDRAMINE HCL 50 MG/ML IJ SOLN
50 | INTRAMUSCULAR | 5 refills | 7.50000 days | Status: DC
Start: 2023-07-04 — End: 2023-12-30

## 2023-07-04 MED ORDER — OXYCODONE HCL 30 MG PO TABS
30 | ORAL_TABLET | ORAL | 0 refills | Status: DC | PRN
Start: 2023-07-04 — End: 2023-07-04

## 2023-07-04 MED ORDER — ROSUVASTATIN CALCIUM 5 MG PO TABS
5 | ORAL_TABLET | Freq: Every evening | ORAL | 5 refills | Status: DC
Start: 2023-07-04 — End: 2024-03-04

## 2023-07-04 MED ORDER — DIAZEPAM 10 MG PO TABS
10 | ORAL_TABLET | ORAL | 3 refills | Status: AC
Start: 2023-07-04 — End: 2023-08-03

## 2023-07-04 NOTE — Other (Signed)
1.  Cholesterol: 215, (was 219); LDL: 129, (was 130); triglycerides: 238, (was 233); HDL low at 39, (was 43).  We can try to continue to control with diet but may need to add a low-dose of Crestor. Crestor has been shown to help reduce the plaque build up in the arteries that can cause a heart attack or stroke. Will send prescription to the pharmacy.     Remainder of her labs are good.

## 2023-07-04 NOTE — Telephone Encounter (Signed)
Patient's mother is asking if she can take 80 mg of oxycodone at night because she wakes up screaming in pain.

## 2023-07-05 ENCOUNTER — Encounter

## 2023-07-05 MED ORDER — OXYCODONE HCL 30 MG PO TABS
30 MG | ORAL_TABLET | ORAL | 0 refills | Status: AC | PRN
Start: 2023-07-05 — End: 2023-08-04

## 2023-07-05 NOTE — Telephone Encounter (Signed)
Patient's mother wanted to see if we could increase the oxycodone at night because she wakes up screaming. The pharmacy would not fill that prescription. Will go back to the original dose.

## 2023-07-21 ENCOUNTER — Encounter: Admit: 2023-07-21 | Admitting: Family Medicine

## 2023-07-21 DIAGNOSIS — R52 Pain, unspecified: Secondary | ICD-10-CM

## 2023-07-21 MED ORDER — FENTANYL 100 MCG/HR TD PT72
100 MCG/HR | MEDICATED_PATCH | TRANSDERMAL | 0 refills | Status: DC
Start: 2023-07-21 — End: 2023-09-06

## 2023-07-21 NOTE — Telephone Encounter (Signed)
 Patient needs a refill on the Fentanyl patch.

## 2023-07-22 NOTE — ED Provider Notes (Signed)
 Department of Emergency Medicine  [place] Allen, DO   Encounter Date  07/22/2023     Patient Name: Jacqueline Simmons  MRN: [id number]  Birthdate [birth year]     Chief Complaint   Chief Complaint   Patient presents with   . Overdose         History of Present Illness

## 2023-08-02 ENCOUNTER — Encounter: Admit: 2023-08-02 | Admitting: Family Medicine

## 2023-08-02 DIAGNOSIS — F5101 Primary insomnia: Secondary | ICD-10-CM

## 2023-08-04 MED ORDER — TEMAZEPAM 30 MG PO CAPS
30 | ORAL_CAPSULE | ORAL | 3 refills | Status: DC
Start: 2023-08-04 — End: 2023-09-06

## 2023-08-08 ENCOUNTER — Encounter

## 2023-08-08 NOTE — Telephone Encounter (Signed)
 She would like to be referred to [facility] for [facility]   Phone: [phone]   Fax: [phone]    She also needs a refill of her pain medication. It is not lasting. She wants a referral to pain management to discuss getting a pain p

## 2023-08-09 MED ORDER — OXYCODONE HCL 30 MG PO TABS
30 MG | ORAL_TABLET | ORAL | 0 refills | Status: DC | PRN
Start: 2023-08-09 — End: 2023-09-06

## 2023-08-18 NOTE — Telephone Encounter (Signed)
 [facility] [place] requesting a call back to clarify prescription    [phone]

## 2023-08-22 NOTE — Telephone Encounter (Signed)
 Clarified temazepam to take 1-2 at hs per Dr Britta Mccreedy verbal order.

## 2023-09-06 ENCOUNTER — Encounter

## 2023-09-06 MED ORDER — OXYCODONE HCL 30 MG PO TABS
30 MG | ORAL_TABLET | ORAL | 0 refills | Status: AC | PRN
Start: 2023-09-06 — End: 2023-10-06

## 2023-09-06 MED ORDER — FENTANYL 100 MCG/HR TD PT72
100 MCG/HR | MEDICATED_PATCH | TRANSDERMAL | 0 refills | Status: AC
Start: 2023-09-06 — End: 2023-10-06

## 2023-09-06 MED ORDER — TEMAZEPAM 30 MG PO CAPS
30 MG | ORAL_CAPSULE | ORAL | 3 refills | Status: DC
Start: 2023-09-06 — End: 2023-10-15

## 2023-09-06 NOTE — Telephone Encounter (Signed)
Rx's have been sent to the pharmacy  

## 2023-09-13 ENCOUNTER — Encounter

## 2023-09-14 MED ORDER — ONDANSETRON 4 MG PO TBDP
4 MG | ORAL_TABLET | Freq: Three times a day (TID) | ORAL | 5 refills | Status: DC | PRN
Start: 2023-09-14 — End: 2023-12-03

## 2023-09-29 ENCOUNTER — Encounter

## 2023-09-29 NOTE — Telephone Encounter (Signed)
 Refills sent to the pharmacy.

## 2023-09-30 MED ORDER — OXYCODONE HCL 30 MG PO TABS
30 | ORAL_TABLET | ORAL | 0 refills | Status: AC | PRN
Start: 2023-09-30 — End: 2023-11-06

## 2023-09-30 MED ORDER — FENTANYL 100 MCG/HR TD PT72
100 MCG/HR | MEDICATED_PATCH | TRANSDERMAL | 0 refills | Status: DC
Start: 2023-09-30 — End: 2023-10-15

## 2023-10-01 ENCOUNTER — Telehealth

## 2023-10-01 MED ORDER — HYDROMORPHONE HCL 3 MG RE SUPP
3 | Freq: Four times a day (QID) | RECTAL | 0 refills | Status: AC | PRN
Start: 2023-10-01 — End: 2023-10-08

## 2023-10-01 NOTE — Telephone Encounter (Signed)
Patient's mother called because the patient has been in a crisis and in excruciating pain. The pain is causing her blood pressure to elevate. She is asking for a few dilaudid suppositories to get her out of crisis.  Have sent Dilaudid 3 mg suppositories #10 to the pharmacy.

## 2023-10-12 ENCOUNTER — Inpatient Hospital Stay
Admit: 2023-10-12 | Discharge: 2023-10-12 | Disposition: A | Discharge status: Home / Self Care | Payer: PRIVATE HEALTH INSURANCE | Attending: Emergency Medicine

## 2023-10-12 ENCOUNTER — Emergency Department: Admit: 2023-10-12 | Payer: PRIVATE HEALTH INSURANCE | Primary: Family Medicine

## 2023-10-12 DIAGNOSIS — S63501D Unspecified sprain of right wrist, subsequent encounter: Secondary | ICD-10-CM

## 2023-10-12 NOTE — ED Triage Notes (Signed)
Ambulatory with steady gait into room 11. Reports fell while walking up steps with dog approx 3 weeks ago. Attempted to catch self with right hand bending wrist backwards. Seen at urgent care couple days later with negative xrays. Placed in splint and told to wear for 2 weeks. Not getting better.
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
## 2023-10-15 ENCOUNTER — Telehealth

## 2023-10-15 NOTE — Telephone Encounter (Signed)
 Call received from Jacqueline Simmons's mother. Her blood pressure is 288/189. She is in a severe pain crisis. The other day it was 108/68 because her pian was under control. The Dilaudid suppositories help but very difficult to find. The pharmacist suggested Morphine suppositories 20 mg every 4 hours along with the oxycodone.  Jacqueline Simmons wishes to stop the temazepam and Fentanyl patches.

## 2023-10-16 MED ORDER — MORPHINE SULFATE 20 MG RE SUPP
20 MG | RECTAL | 0 refills | Status: DC | PRN
Start: 2023-10-16 — End: 2023-10-17

## 2023-10-17 ENCOUNTER — Ambulatory Visit
Admit: 2023-10-17 | Discharge: 2023-10-17 | Payer: PRIVATE HEALTH INSURANCE | Attending: Family Medicine | Primary: Family Medicine

## 2023-10-17 ENCOUNTER — Encounter: Payer: PRIVATE HEALTH INSURANCE | Attending: Family | Primary: Family Medicine

## 2023-10-17 ENCOUNTER — Encounter: Payer: PRIVATE HEALTH INSURANCE | Primary: Family Medicine

## 2023-10-17 VITALS — BP 134/73 | HR 87 | Temp 98.80000°F | Ht 61.0 in

## 2023-10-17 DIAGNOSIS — M329 Systemic lupus erythematosus, unspecified: Secondary | ICD-10-CM

## 2023-10-17 MED ORDER — MORPHINE SULFATE 20 MG RE SUPP
20 MG | RECTAL | 0 refills | Status: AC | PRN
Start: 2023-10-17 — End: 2023-10-24

## 2023-10-17 MED ORDER — KETOROLAC TROMETHAMINE 60 MG/2ML IM SOLN
602 | Freq: Once | INTRAMUSCULAR | Status: AC
Start: 2023-10-17 — End: 2023-10-17
  Administered 2023-10-17: 18:00:00 60 mg via INTRAMUSCULAR

## 2023-10-17 MED ORDER — DEXAMETHASONE 4 MG/ML IJ SOLN (MIXTURES ONLY)
4 | Freq: Once | INTRAMUSCULAR | Status: AC
Start: 2023-10-17 — End: 2023-10-17
  Administered 2023-10-17: 18:00:00 8 mg via INTRAMUSCULAR

## 2023-10-17 MED ORDER — SILDENAFIL CITRATE 100 MG PO TABS
100 | ORAL_TABLET | Freq: Three times a day (TID) | ORAL | 5 refills | 20.00000 days | Status: DC | PRN
Start: 2023-10-17 — End: 2024-06-06

## 2023-10-17 NOTE — Patient Instructions (Signed)
 [employer]*  (Call 211/United Way if you need more resources.)    Medicaid Zenaida Niece: [employer]   They offer: Non-emergency medical transportation for [place] and some Medicare Advantage plans  Contact: [phone]   Helpful Info: Must call for a ride at least 3 days before your appointment.  Call Monday- Friday 8:00am to 5:00pm. Transportation is available for MD appts, dialysis, x-rays, lab work, drug store, or other medical appointments.     [place]  What they offer: Provides assistance and medical transport to adults 60 years and older.  Contact: [phone]    [employer]   What they offer: Charge a fee for transport (not free).  Contact: [phone]    Transportation on Demand   They Offer: Charge a fee for transport (not free).  Contact: [phone]                          [employer]*  (Call United Way/211 if you need more resources.)    [employer]  They Offer: Arts administrator (Section 8) rental assistance available to persons with disabilities, families with children, and older adults whose household income is below 80% the median income for [facility].   Contact: [phone]; Website:www.tgha.net    Department of [employer] Homeless - [employer]   They offer: Free 24/7 access to trained counselors for local resources and assistance.  Contact: [phone] Website: FuneralLife.ca.asp      Afforablehousing.com    https://www.affordablehousing.com/[place]/    [employer] Search    TaskPreview.hu    United Housing Connections:  They Offer: Homelessness Prevention, Affordable Housing, Outreach Support  Contact: [phone]; [address], [place], [place] [address]  Helpful Info: Hours are Monday -Friday 8:30am-4:30pm.    [facility] [place]   They Offer: Provides shelter to homeless men and families,  emergency housing, meals, clothing, personal care items, gospel services, discipleship, personal counseling, drug addiction counseling, GED training. To receive services, families must have marriage license to receive housing.  Contact: [address] [place], [place] [address]; [phone]  Helpful Info:  Hours of Operation: 8:00 a.m. - 9:00 p.m. Monday -Friday      [employer]   They Offer: Helps with Transitional housing (first week is free, then $7 per day if single; families with children pay $25.00 per week), food, emergency shelter for men and women, helps with prescriptions, but only up to $125 per person per year. To receive services, you need the cut off notice or eviction notice to receive help. Must have proof of address, proof of income, and Social Security card for every individual in the household, photo identification for the head of household. ** Does not pay deposits for utilities or rent.   Contact:  [address] [place] [address] [phone]   Helpful Info:  Hours of Operation: 9:00 a.m.-12:00 p.m.; 1:00 p.m. - 4:00 p.m. Monday-Friday.     Boarding Homes:   [place] [phone]  Not [employer]gotten [phone]  Helping Hands [phone]    Home Repairs:  [employer]  They offer:  Home repairs non profit   Contact:  [phone] BestTheory.uy  Home Works of [employer].   They offer:  Home repairs for low-income seniors- must own home.   Contact:  [phone]  www.homeworksofamerica.[employer]*  (Call United Way/211 if you  need more resources.)    Meals on Wheels  They offer: Meal delivery for eligible individuals.  Contact: [phone]    [facility] Food Share  They offer: [employer]. $5 for SNAP/EBT persons, $15 for all others.  Contact: http://harding.org/.org/fsg (must preorder from site).   Helpful Info: Pickup every other Wednesday 11am-6pm at [address] [place], [place] [address]    [facility]  Food Pantry  They offer: Groceries/food.  Contact: [phone]; [address] [place], [place] [address]  Helpful Info: Open Thursday 8am-12pm.    [employer]  They offer: Groceries/food.  Contact: [phone]; [address] [place], [place] [address]  Helpful Info: Open 8am-5pm Monday-Thursdays, 8am-12pm Fridays.    [employer] [facility] Pant[facility]  They offer: Groceries/food.  Contact: [phone]; [address] [place], [place] [address]  Helpful Info: Must call for hours and availability.     [employer] [place]   They offer: [employer].   Contact: [phone]; [address] [place], [place] [address]  Helpful Info: Food bags/ hygiene kits Each Wednesday 7am- 11pm.  Hot Meals every Saturday at 12 noon and every Sunday after worship.  Hot Breakfast every Monday 7am- 8am.    [employer] Pantry  They offer: Groceries/food.  Contact: [phone]  Helpful Info: Call for hours and availability.     [facility]  They offer: Emergency food.  Contact: [phone]; [address] [place], [place] [address]  Helpful Info: Hours are Monday, Wednesday, and Friday 9am-1pm.     [employer] (formally [employer]) Food Pantry  They offer: Groceries/food.  Contact: [phone]; [address], [place], [place] [address]  Helpful Info: Call for hours and availability.     [employer] Personal assistant  They offer: Groceries/food.  Contact: [phone]; [address], [place], [place] [address]  Helpful Info: Call for hours and availability.     [employer] [facility] [employer] [facility]   They offer: Food   Contact:  [phone] [address], [place], [place] [address]  Helpful Info: Hours are Tuesday and Thursday 10am- 12:30pm[place] Utility - Financial Resources*  (Call United Way/211 if you need more resources.)      Utility Assistance     [facility] Low Income Home Energy Assistance Program  They offer: energy assistance.  Contact: [phone];  https://www.liheapoffices.com/city[place]  Helpful Info: Must be a [place] resident and need financial assistance with home energy costs.    Consulting civil engineer   They offer: wide range of services to low and moderate-income residents in [place]  Contact: [phone]; [address] [place], [place] [address]    [employer]    They offer: basic needs for stability and support services.   Contact: [phone]; [address] [place], [place] [address]     [facility] [place]   They offer: help for families and individuals in need to pay rent and utility bills, purchase clothing, and food.    Contact: [phone]; [address] [place], [place] [address]    Marlyce Huge of [facility]   They offer:  assistance with utility bills   Contact:  [phone]; [address], [place], [place] [address]       [employer]te [employer]   They offer: Annette Stable, clothes, food, and rent assistance.  Case Management and Counseling services available.   Contact: [phone] [address] [place], [place] [address]    Medical/Financial  [employer] Financial Assistance  They offer: help uninsured patients who do  not qualify for government-sponsored health insurance and cannot afford to pay for their medical care. Insured patients may also qualify depending on family income, family size, and medical needs.   Contact: [phone]   Helpful Info: How to apply-  Option 1: Fill out the Financial Assistance Application(FAP). Copies of the Financial Assistance Application and the FAP may be obtained for free by calling the [employer] customer service department at [phone].   Option 2: download a copy from the [employer]: https://www.bonsecours.com/patient-resources/financial-assistance       [employer]  They offer: medication cost assistance, personal care items, and small durable medical equipment to low income and uninsured patients with chronic health conditions.   Contact: [phone] or  [phone]     Wellness Outreach  They offer: connections to financial assistance for social and medical services for low income and uninsured persons.   Contact: [phone] (leave message with name and contact number if no answer).    [facility]  They offer: assist people experiencing poverty connect with the resources they need to thrive.  Contact: [phone]         Medication Assistance:    Good Rx   They offer: Coupons for discounts on medications.   Website: https://www.goodrx.com/       NeedyMeds   They offer: free information on medications and healthcare cost savings programs including prescription assistance programs, coupons, and discount programs.   Contact: [phone]; https://www.needymeds.org/     RX Assist   They offer: Information about free and low-cost medicine programs.   Website: https://www.clark.net/     [employer] $4 Prescription Program   They offer: Prescription Program; includes up to a 30-day supply for $4 and a 90-day supply for $10 of some covered generic drugs at commonly prescribed dosages   Website: GotForum.co.uk       Scharlene Gloss  They offer: If you are uninsured and cannot afford the prescription medicine you need, you may be able to have your prescriptions filled at no cost through [facility].  You must live in [place].   Helpful Info: To find out if you qualify visit www.wellvista.org     Cost Plus Drugs  What they offer:  Low-cost versions of high-cost generic drugs  Website: https://costplusdrugs.com/    Medications of Hope   They offer: Medication assistance to patients without prescription insurance and below poverty line.   Contact:  [phone] [facility] [address] [place], [place] [address]

## 2023-10-17 NOTE — Progress Notes (Signed)
 [facility]  [name], M.D.  [address]  [place], [place] [address]  Phone:  [phone]  Fax:  [phone]    CHIEF COMPLAINT:  Chief Complaint   Patient presents with    Chronic Pain     Patient states her pain is so severe due to the Lupus past 3-4 months have been unbearable. She is mostly wheelchair bound. She is trying to still work but she is not eligible for [employer]. She may be eligible for short term disability. Patient states her pain is unbearable and is oral pain medication is helpful at times but a lot of her pain medication is on back order. Pain is throughout her body.         HISTORY OF PRESENT ILLNESS:  Ms. Mayden is a 43 y.o. female  who presents for follow up. Lizzy Hard presents with severe pain and elevated blood pressure. She reports being unable to sleep in her room due to pain, which causes her to need assistance getting to the bathroom and sometimes results in her ending up on the floor after her legs give out. The cold weather exacerbates her symptoms. The pain is affecting her daily functioning, as she and her partner have taken time off work to be around the house and have been unable to celebrate holidays or see family.    Jisele describes difficulty getting in and out of bed easily. She mentions that her right finger pain has resolved. About two weeks ago, she experienced a fall where she twisted and fractured her right wrist. Initially, she went to urgent care where they applied a splint, but the pain worsened to the point where she was "screaming." Friends took her to [facility], where it was diagnosed as a sprain. She later went to [facility] for further evaluation. They then found the fracture.    She has a history of lupus and Raynaud's disease. Patient shows a picture where all of her fingers are white down to the PIP joints.     Right now she is in a severe pain crisis. Her blood pressure increased to 288/188 a few days ago. She is now in a  wheelchair because her legs ache so bad she can't walk. Oral pain medications and the Fentanyl patch are not helping. Her mother is a Engineer, civil (consulting) and is asking if she can have morphine suppositories to get her out of pain.     No other complaints. Taking medications as prescribed. Medications reviewed and updated.     HISTORY:  Allergies   Allergen Reactions    Aspirin Hives, Itching, Rash, Shortness Of Breath and Swelling    Levaquin [Levofloxacin] Anaphylaxis    Bactrim [Sulfamethoxazole-Trimethoprim] Angioedema     Eyes swell    Nsaids Hives and Angioedema     Tongue swells       REVIEW OF SYSTEMS:  Review of systems is as indicated in HPI otherwise negative.    PHYSICAL EXAM:  Visit Vitals  BP 134/73   Pulse 87   Temp 98.8 F (37.1 C) (Temporal)   Ht 1.549 m (5\' 1" )   SpO2 97%   BMI 37.41 kg/m        Physical Exam  Vitals and nursing note reviewed.   Constitutional:       Appearance: Normal appearance. She is obese.   HENT:      Head: Normocephalic and atraumatic.      Nose: Nose normal.  Mouth/Throat:      Mouth: Mucous membranes are moist.   Eyes:      Extraocular Movements: Extraocular movements intact.      Pupils: Pupils are equal, round, and reactive to light.   Cardiovascular:      Rate and Rhythm: Normal rate and regular rhythm.      Pulses: Normal pulses.   Pulmonary:      Effort: Pulmonary effort is normal.      Breath sounds: Normal breath sounds.   Abdominal:      General: Abdomen is flat.      Palpations: Abdomen is soft.   Musculoskeletal:      Left forearm: Normal.      Right wrist: Decreased range of motion.      Left wrist: Normal.      Cervical back: Normal range of motion and neck supple. Tenderness present.      Thoracic back: Spasms and tenderness present.      Lumbar back: Spasms and tenderness present.        Back:         Legs:       Comments: Right arm in a cast.    Evidence of skin grafting on the LLE. Moderate swelling in the left lower leg and foot.      Skin:     General: Skin is  warm and dry.      Findings: Rash present.      Comments: Rash on    Neurological:      Mental Status: She is alert and oriented to person, place, and time.      Cranial Nerves: Cranial nerves 2-12 are intact.      Sensory: Sensation is intact.      Motor: Weakness present.      Coordination: Coordination abnormal.      Gait: Gait abnormal.      Comments: Sitting in a wheelchair because her gait is very stiff and unsteady.   Psychiatric:         Attention and Perception: Attention and perception normal.         Mood and Affect: Mood normal. Affect is tearful.         Speech: Speech normal.         Behavior: Behavior normal.         PHQ:      10/17/2023    11:55 AM   PHQ-9    Depression Unable to Assess Urgent/emergent situation       LABS  No results found for this visit on 10/17/23.  Orders Only on 06/30/2023   Component Date Value Ref Range Status    Vit D, 25-Hydroxy 06/30/2023 32.4  30.0 - 100.0 ng/mL Final    Comment: Deficiency         <20.0 ng/mL  Insufficiency       20.0-29.9 ng/mL      T4 Free 06/30/2023 0.9  0.9 - 1.7 NG/DL Final    TSH, 3rd Generation 06/30/2023 1.660  0.270 - 4.200 uIU/mL Final    Cholesterol, Total 06/30/2023 215 (H)  0 - 200 MG/DL Final    Comment: Borderline High: 200-239 mg/dL  High: Greater than or equal to 240 mg/dL      Triglycerides 43/32/9518 238 (H)  0 - 150 MG/DL Final    Comment: Borderline High: 150-199 mg/dL, High: 841-660 mg/dL  Very High: Greater than or equal to 500 mg/dL      HDL 63/08/6008 39 (L)  40 - 60 MG/DL Final    LDL Cholesterol 06/30/2023 129 (H)  0 - 100 MG/DL Final    Comment: Near Optimal: 100-129 mg/dL  Borderline High: 604-540, High: 160-189 mg/dL  Very High: Greater than or equal to 190 mg/dL      VLDL Cholesterol Calculated 06/30/2023 48 (H)  6 - 23 MG/DL Final    Chol/HDL Ratio 06/30/2023 5.6 (H)  0.0 - 5.0   Final    Sodium 06/30/2023 140  136 - 145 mmol/L Final    Potassium 06/30/2023 4.8  3.5 - 5.1 mmol/L Final    Chloride 06/30/2023 102  98 - 107  mmol/L Final    CO2 06/30/2023 26  20 - 29 mmol/L Final    Anion Gap 06/30/2023 12  7 - 16 mmol/L Final    Glucose 06/30/2023 95  70 - 99 mg/dL Final    Comment: <98 mg/dL Consistent with, but not fully diagnostic of hypoglycemia.  100 - 125 mg/dL Impaired fasting glucose/consistent with pre-diabetes mellitus.  > 126 mg/dl Fasting glucose consistent with overt diabetes mellitus      BUN 06/30/2023 11  6 - 23 MG/DL Final    Creatinine 11/91/4782 1.06  0.60 - 1.10 MG/DL Final    Est, Glom Filt Rate 06/30/2023 67  >60 ml/min/1.58m2 Final    Comment:   Pediatric calculator link: https://www.kidney.org/professionals/kdoqi/gfr_calculatorped    These results are not intended for use in patients <85 years of age.    eGFR results are calculated without a race factor using  the 2021 CKD-EPI equation. Careful clinical correlation is recommended, particularly when comparing to results calculated using previous equations.  The CKD-EPI equation is less accurate in patients with extremes of muscle mass, extra-renal metabolism of creatinine, excessive creatine ingestion, or following therapy that affects renal tubular secretion.      Calcium 06/30/2023 9.7  8.8 - 10.2 MG/DL Final    Total Bilirubin 06/30/2023 <0.2  0.0 - 1.2 MG/DL Final    ALT 95/62/1308 16  8 - 45 U/L Final    AST 06/30/2023 23  15 - 37 U/L Final    Alk Phosphatase 06/30/2023 107 (H)  35 - 104 U/L Final    Total Protein 06/30/2023 7.0  6.3 - 8.2 g/dL Final    Albumin 65/78/4696 3.3 (L)  3.5 - 5.0 g/dL Final    Globulin 29/52/8413 3.7 (H)  2.3 - 3.5 g/dL Final    Albumin/Globulin Ratio 06/30/2023 0.9 (L)  1.0 - 1.9   Final       IMPRESSION/PLAN     Diagnosis Orders   1. Lupus arthritis (HCC)  Morphine Sulfate 20 MG SUPP      2. Raynaud's disease without gangrene  sildenafil (VIAGRA) 100 MG tablet      3. Chronic pain syndrome  ketorolac (TORADOL) injection 60 mg    dexAMETHasone (DECADRON) injection 8 mg      4. Pain crisis  Morphine Sulfate 20 MG SUPP      5.  Closed fracture of right wrist, initial encounter  [facility] (Hand Surgery)      6. Encounter for long-term (current) use of medications            Follow up and Dispositions:  Return for AS SCHEDULED.     Patient will continue current medications.  Will add the following medications: morphine sulphate suppositories as needed for pain crisis. Sildenafil 100 mg 1/2 tablet as needed for Raynauds.   Reviewed medications and side  effects in detail.  Will check labs before next visit.  Reviewed most recent labs.  Reviewed diet, exercise and weight control.  Cardiovascular risks and recommendations reviewed.  Patient encouraged to follow a low sodium diet.  Use of aspirin to prevent MIs and TIAs discussed.   Will refer to [facility] for her arm fracture.    I have reviewed the patient's past medical history, social history and family history and vitals.  We have discussed treatment plan and follow up and given patient instructions.  Patient's questions are answered and we will follow up as indicated.    [name], MD    Dictated using voice recognition software. Proofread, but unrecognized voice recognition errors may exist.    [facility] PRESCRIPTION DRUG MONITORING SEARCH DONE. PATIENT IS IN COMPLIANCE.

## 2023-10-21 ENCOUNTER — Encounter: Payer: PRIVATE HEALTH INSURANCE | Attending: Family | Primary: Family Medicine

## 2023-10-21 ENCOUNTER — Encounter: Payer: PRIVATE HEALTH INSURANCE | Attending: Family Medicine | Primary: Family Medicine

## 2023-10-21 NOTE — Telephone Encounter (Signed)
 Patient's mother called stating that the morphine suppositories are helping the pain. However, she is still in a pain crisis and can barely walk. She is asking for an excuse for this week.

## 2023-10-23 ENCOUNTER — Telehealth

## 2023-10-23 MED ORDER — KETOROLAC TROMETHAMINE 60 MG/2ML IM SOLN
602 | Freq: Four times a day (QID) | INTRAMUSCULAR | 5 refills | Status: DC | PRN
Start: 2023-10-23 — End: 2023-10-23

## 2023-10-23 MED ORDER — MORPHINE SULFATE 20 MG RE SUPP
20 | RECTAL | 0 refills | Status: AC | PRN
Start: 2023-10-23 — End: 2023-11-06

## 2023-10-23 MED ORDER — KETOROLAC TROMETHAMINE 60 MG/2ML IM SOLN
602 | Freq: Four times a day (QID) | INTRAMUSCULAR | 5 refills | Status: AC | PRN
Start: 2023-10-23 — End: ?

## 2023-10-23 MED ORDER — MORPHINE SULFATE 20 MG RE SUPP
20 | RECTAL | 0 refills | Status: DC | PRN
Start: 2023-10-23 — End: 2023-10-23

## 2023-10-23 NOTE — Telephone Encounter (Signed)
 Call received from patient's mother. They are still waiting on the [name] to get the morphine suppositories. She is asking for a few to be sent to [facility].

## 2023-10-30 ENCOUNTER — Ambulatory Visit
Admit: 2023-10-30 | Discharge: 2023-10-30 | Payer: PRIVATE HEALTH INSURANCE | Attending: Family Medicine | Primary: Family Medicine

## 2023-10-30 VITALS — BP 137/83 | HR 97 | Temp 97.30000°F | Ht 61.0 in | Wt 163.1 lb

## 2023-10-30 DIAGNOSIS — M329 Systemic lupus erythematosus, unspecified: Secondary | ICD-10-CM

## 2023-10-30 MED ORDER — TIZANIDINE HCL 4 MG PO TABS
4 | ORAL_TABLET | ORAL | 5 refills | Status: DC
Start: 2023-10-30 — End: 2024-02-24

## 2023-10-30 NOTE — Progress Notes (Signed)
 [facility]  [name], M.D.  [address]  [place], [place] [address]  Phone:  [phone]  Fax:  [phone]    CHIEF COMPLAINT:  Chief Complaint   Patient presents with    Chronic Pain     Patient states morphine suppositories are working well. She has not had any pain crisis' since starting them.     Allergic Reaction     Patient had an allergic reaction to the Toradol injection she had at her last visit. She states one eye swelled shut. She is wondering if her reaction will get worse if she continues to use it.         HISTORY OF PRESENT ILLNESS:  Jacqueline Simmons is a 43 y.o. female  who presents for follow up. The patient reports significant weight loss, having lost approximately 64-66 pounds over the past year. She expresses a lack of appetite, stating "I don't wanna eat. I'm not gonna eat. I don't feel good," though she mentions consuming mashed potatoes and grapes.    She has a history of lupus, and Raynaud's disease. The patient reports joint pain and skin discomfort, particularly during cold weather. She mentions difficulty getting out of bed, with her legs getting "stuck" or her foot becoming bent. She also notes changes in her nails, describing them as bending, enlarging, and curving over.    The patient reports fatigue, stating she feels tired even after sleeping all day. She mentions having difficulty walking during her last visit, though this symptom has improved.    The patient is currently using morphine suppositories for pain management. She also mentions using Zofran for nausea, which she describes as her "best friend" due to its rapid onset of action when dissolved on the tongue. She uses Compazine when Zofran is ineffective for severe nausea.    Patient had an allergic reaction to the Toradol injection she had at her last visit. She states one eye swelled shut. She is wondering if her reaction will get worse if she continues to use it.     No other complaints. Taking  medications as prescribed. Medications reviewed and updated.     HISTORY:  Allergies   Allergen Reactions    Aspirin Hives, Itching, Rash, Shortness Of Breath and Swelling    Levaquin [Levofloxacin] Anaphylaxis    Bactrim [Sulfamethoxazole-Trimethoprim] Angioedema     Eyes swell    Nsaids Hives and Angioedema     Tongue swells    Toradol [Ketorolac Tromethamine] Angioedema     Eyelids swell       REVIEW OF SYSTEMS:  Review of systems is as indicated in HPI otherwise negative.    PHYSICAL EXAM:  Visit Vitals  BP 137/83   Pulse 97   Temp 97.3 F (36.3 C) (Temporal)   Ht 1.549 m (5\' 1" )   Wt 74 kg (163 lb 1.6 oz)   SpO2 98%   BMI 30.82 kg/m        Physical Exam  Vitals and nursing note reviewed.   Constitutional:       Appearance: Normal appearance. She is obese.   HENT:      Head: Normocephalic and atraumatic.      Nose: Nose normal.      Mouth/Throat:      Mouth: Mucous membranes are moist.   Eyes:      Extraocular Movements: Extraocular movements intact.      Pupils: Pupils are equal, round, and reactive to light.   Cardiovascular:  Rate and Rhythm: Normal rate and regular rhythm.      Pulses: Normal pulses.   Pulmonary:      Effort: Pulmonary effort is normal.      Breath sounds: Normal breath sounds.   Abdominal:      General: Abdomen is flat.      Palpations: Abdomen is soft.   Musculoskeletal:      Left forearm: Normal.      Right wrist: Decreased range of motion.      Left wrist: Normal.      Cervical back: Normal range of motion and neck supple. Tenderness present.      Thoracic back: Spasms and tenderness present.      Lumbar back: Spasms and tenderness present.        Back:         Legs:       Comments: Evidence of skin grafting on the LLE. Moderate swelling in the left lower leg and foot.      Skin:     General: Skin is warm and dry.      Findings: Rash present.      Comments: Rash on    Neurological:      Mental Status: She is alert and oriented to person, place, and time.      Cranial Nerves:  Cranial nerves 2-12 are intact.      Sensory: Sensation is intact.      Motor: Weakness present.      Coordination: Coordination abnormal.      Gait: Gait abnormal.      Comments: Her gait is very stiff and unsteady.   Psychiatric:         Attention and Perception: Attention and perception normal.         Mood and Affect: Mood normal. Affect is tearful.         Speech: Speech normal.         Behavior: Behavior normal.         PHQ:      10/17/2023    11:55 AM   PHQ-9    Depression Unable to Assess Urgent/emergent situation       LABS  No results found for this visit on 10/30/23.  Orders Only on 06/30/2023   Component Date Value Ref Range Status    Vit D, 25-Hydroxy 06/30/2023 32.4  30.0 - 100.0 ng/mL Final    Comment: Deficiency         <20.0 ng/mL  Insufficiency       20.0-29.9 ng/mL      T4 Free 06/30/2023 0.9  0.9 - 1.7 NG/DL Final    TSH, 3rd Generation 06/30/2023 1.660  0.270 - 4.200 uIU/mL Final    Cholesterol, Total 06/30/2023 215 (H)  0 - 200 MG/DL Final    Comment: Borderline High: 200-239 mg/dL  High: Greater than or equal to 240 mg/dL      Triglycerides 29/56/2130 238 (H)  0 - 150 MG/DL Final    Comment: Borderline High: 150-199 mg/dL, High: 865-784 mg/dL  Very High: Greater than or equal to 500 mg/dL      HDL 69/62/9528 39 (L)  40 - 60 MG/DL Final    LDL Cholesterol 06/30/2023 129 (H)  0 - 100 MG/DL Final    Comment: Near Optimal: 100-129 mg/dL  Borderline High: 413-244, High: 160-189 mg/dL  Very High: Greater than or equal to 190 mg/dL      VLDL Cholesterol Calculated 06/30/2023 48 (H)  6 -  23 MG/DL Final    Chol/HDL Ratio 06/30/2023 5.6 (H)  0.0 - 5.0   Final    Sodium 06/30/2023 140  136 - 145 mmol/L Final    Potassium 06/30/2023 4.8  3.5 - 5.1 mmol/L Final    Chloride 06/30/2023 102  98 - 107 mmol/L Final    CO2 06/30/2023 26  20 - 29 mmol/L Final    Anion Gap 06/30/2023 12  7 - 16 mmol/L Final    Glucose 06/30/2023 95  70 - 99 mg/dL Final    Comment: <16 mg/dL Consistent with, but not fully diagnostic  of hypoglycemia.  100 - 125 mg/dL Impaired fasting glucose/consistent with pre-diabetes mellitus.  > 126 mg/dl Fasting glucose consistent with overt diabetes mellitus      BUN 06/30/2023 11  6 - 23 MG/DL Final    Creatinine 10/96/0454 1.06  0.60 - 1.10 MG/DL Final    Est, Glom Filt Rate 06/30/2023 67  >60 ml/min/1.40m2 Final    Comment:   Pediatric calculator link: https://www.kidney.org/professionals/kdoqi/gfr_calculatorped    These results are not intended for use in patients <76 years of age.    eGFR results are calculated without a race factor using  the 2021 CKD-EPI equation. Careful clinical correlation is recommended, particularly when comparing to results calculated using previous equations.  The CKD-EPI equation is less accurate in patients with extremes of muscle mass, extra-renal metabolism of creatinine, excessive creatine ingestion, or following therapy that affects renal tubular secretion.      Calcium 06/30/2023 9.7  8.8 - 10.2 MG/DL Final    Total Bilirubin 06/30/2023 <0.2  0.0 - 1.2 MG/DL Final    ALT 09/81/1914 16  8 - 45 U/L Final    AST 06/30/2023 23  15 - 37 U/L Final    Alk Phosphatase 06/30/2023 107 (H)  35 - 104 U/L Final    Total Protein 06/30/2023 7.0  6.3 - 8.2 g/dL Final    Albumin 78/29/5621 3.3 (L)  3.5 - 5.0 g/dL Final    Globulin 30/86/5784 3.7 (H)  2.3 - 3.5 g/dL Final    Albumin/Globulin Ratio 06/30/2023 0.9 (L)  1.0 - 1.9   Final       IMPRESSION/PLAN     Diagnosis Orders   1. Lupus arthritis (HCC)  [facility] - Burman Blacksmith, DO, Rheumatology, [place]      2. Raynaud's disease without gangrene  [facility] - Burman Blacksmith, DO, Rheumatology, [place]      3. Chronic pain syndrome  [facility] - Burman Blacksmith, DO, Rheumatology, [place]      4. Muscle spasm  tiZANidine (ZANAFLEX) 4 MG tablet    [facility] - Burman Blacksmith, DO, Rheumatology, [place]      5. Chronic fatigue        6. Encounter for screening mammogram for malignant neoplasm of breast  MAM DIGITAL SCREEN W OR WO CAD BILATERAL      7. Encounter  for long-term (current) use of medications            Follow up and Dispositions:  Return in about 4 months (around 02/29/2024).     Patient will continue current medications.  Refilled the above medications.  Reviewed medications and side effects in detail.  Will check labs before next visit.  Reviewed most recent labs.  Reviewed diet, exercise and weight control.  Cardiovascular risks and recommendations reviewed.  Patient encouraged to follow a low sodium diet.  Use of aspirin to prevent MIs and TIAs discussed.   Will refer to  the rheumatologist.    I have reviewed the patient's past medical history, social history and family history and vitals.  We have discussed treatment plan and follow up and given patient instructions.  Patient's questions are answered and we will follow up as indicated.        [name], MD    Dictated using voice recognition software. Proofread, but unrecognized voice recognition errors may exist.

## 2023-10-31 ENCOUNTER — Telehealth

## 2023-10-31 NOTE — Telephone Encounter (Signed)
 Patient's mother is still waiting for the morphine suppositories. [name] has Dilaudid 3 mg suppositories. She would like some of these.

## 2023-11-01 MED ORDER — HYDROMORPHONE HCL 3 MG RE SUPP
3 | Freq: Four times a day (QID) | RECTAL | 0 refills | Status: DC | PRN
Start: 2023-11-01 — End: 2023-11-04

## 2023-11-03 ENCOUNTER — Telehealth

## 2023-11-03 ENCOUNTER — Encounter: Payer: PRIVATE HEALTH INSURANCE | Attending: Hand Surgery | Primary: Family Medicine

## 2023-11-03 NOTE — Telephone Encounter (Signed)
 Patient still unable to go to work today due to severe pain and weakness in her legs.  Have sent another excuse for 2/25 - 3/11.

## 2023-11-04 ENCOUNTER — Encounter

## 2023-11-04 MED ORDER — HYDROMORPHONE HCL 3 MG RE SUPP
3 | Freq: Four times a day (QID) | RECTAL | 0 refills | Status: DC | PRN
Start: 2023-11-04 — End: 2023-11-17

## 2023-11-04 NOTE — Addendum Note (Signed)
 Addended by: Amada Kingfisher F on: 11/04/2023 11:08 AM     Modules accepted: Orders

## 2023-11-05 MED ORDER — OXYCODONE HCL 30 MG PO TABS
30 MG | ORAL_TABLET | ORAL | 0 refills | Status: DC | PRN
Start: 2023-11-05 — End: 2023-12-03

## 2023-11-09 NOTE — Telephone Encounter (Signed)
 Excuse given until March 17th.

## 2023-11-17 ENCOUNTER — Encounter

## 2023-11-17 MED ORDER — HYDROMORPHONE HCL 3 MG RE SUPP
3 MG | Freq: Four times a day (QID) | RECTAL | 0 refills | Status: DC | PRN
Start: 2023-11-17 — End: 2023-12-02

## 2023-11-17 NOTE — Telephone Encounter (Signed)
 Patient needs a refill of the dilaudid suppositories for pain.

## 2023-11-17 NOTE — Telephone Encounter (Signed)
 Dr Britta Mccreedy, please advise.

## 2023-11-19 NOTE — Telephone Encounter (Signed)
 Patient is still having problems with severe pain and is having trouble getting out of her pain crisis.  She has not been able to work and is asking for an excuse to return to work on March 31.

## 2023-12-02 ENCOUNTER — Encounter

## 2023-12-02 MED ORDER — HYDROMORPHONE HCL 3 MG RE SUPP
3 | Freq: Four times a day (QID) | RECTAL | 0 refills | Status: AC | PRN
Start: 2023-12-02 — End: 2024-01-01

## 2023-12-02 NOTE — Telephone Encounter (Signed)
 Patient is asking for a refill of the Dilaudid supp.

## 2023-12-03 ENCOUNTER — Encounter

## 2023-12-03 NOTE — Telephone Encounter (Signed)
 Patient needs refills on oxycodone and zofran.

## 2023-12-04 MED ORDER — ONDANSETRON 4 MG PO TBDP
4 | ORAL_TABLET | Freq: Three times a day (TID) | ORAL | 5 refills | Status: AC | PRN
Start: 2023-12-04 — End: ?

## 2023-12-04 MED ORDER — OXYCODONE HCL 30 MG PO TABS
30 MG | ORAL_TABLET | ORAL | 0 refills | Status: DC | PRN
Start: 2023-12-04 — End: 2023-12-25

## 2023-12-12 ENCOUNTER — Encounter

## 2023-12-12 MED ORDER — ONDANSETRON 4 MG PO TBDP
4 | ORAL_TABLET | Freq: Three times a day (TID) | ORAL | 5 refills | 7.00000 days | Status: DC | PRN
Start: 2023-12-12 — End: 2023-12-30

## 2023-12-12 MED ORDER — HYDROMORPHONE HCL 3 MG RE SUPP
3 | Freq: Four times a day (QID) | RECTAL | 0 refills | 4.00000 days | Status: DC | PRN
Start: 2023-12-12 — End: 2023-12-18

## 2023-12-12 MED ORDER — METOPROLOL SUCCINATE ER 50 MG PO TB24
50 | ORAL_TABLET | Freq: Every day | ORAL | 5 refills | 52.50000 days | Status: DC
Start: 2023-12-12 — End: 2023-12-30

## 2023-12-12 NOTE — Telephone Encounter (Signed)
 Rxs have been sent to the pharmacy.

## 2023-12-18 ENCOUNTER — Encounter

## 2023-12-18 MED ORDER — HYDROMORPHONE HCL 3 MG RE SUPP
3 | Freq: Four times a day (QID) | RECTAL | 0 refills | 6.00000 days | Status: DC | PRN
Start: 2023-12-18 — End: 2024-01-06

## 2023-12-25 ENCOUNTER — Encounter

## 2023-12-26 MED ORDER — OXYCODONE HCL 30 MG PO TABS
30 | ORAL_TABLET | ORAL | 0 refills | 5.00000 days | Status: DC | PRN
Start: 2023-12-26 — End: 2024-01-12

## 2023-12-30 ENCOUNTER — Telehealth
Admit: 2023-12-30 | Discharge: 2023-12-30 | Payer: PRIVATE HEALTH INSURANCE | Attending: Family Medicine | Primary: Family Medicine

## 2023-12-30 DIAGNOSIS — M329 Systemic lupus erythematosus, unspecified: Secondary | ICD-10-CM

## 2023-12-30 MED ORDER — PANTOPRAZOLE SODIUM 40 MG PO TBEC
40 | ORAL_TABLET | Freq: Every day | ORAL | 5 refills | 30.00000 days | Status: DC
Start: 2023-12-30 — End: 2024-06-06

## 2023-12-30 MED ORDER — METOPROLOL SUCCINATE ER 50 MG PO TB24
50 | ORAL_TABLET | Freq: Every day | ORAL | 5 refills | 52.50000 days | Status: AC
Start: 2023-12-30 — End: ?

## 2023-12-30 MED ORDER — DIPHENHYDRAMINE HCL 50 MG/ML IJ SOLN
50 | INTRAMUSCULAR | 5 refills | 7.50000 days | Status: DC
Start: 2023-12-30 — End: 2024-02-24

## 2023-12-30 MED ORDER — ONDANSETRON 4 MG PO TBDP
4 | ORAL_TABLET | Freq: Three times a day (TID) | ORAL | 5 refills | 7.00000 days | Status: AC | PRN
Start: 2023-12-30 — End: ?

## 2023-12-30 MED ORDER — DIAZEPAM 10 MG PO TABS
10 | ORAL_TABLET | ORAL | 3 refills | 7.00000 days | Status: DC
Start: 2023-12-30 — End: 2024-01-12

## 2023-12-30 MED ORDER — HEPARIN SODIUM (PORCINE) 10000 UNIT/ML IJ SOLN
10000 | Freq: Once | INTRAMUSCULAR | 5 refills | Status: AC | PRN
Start: 2023-12-30 — End: ?

## 2023-12-30 NOTE — Progress Notes (Signed)
 [facility] SERVICES  [facility]  Dr. Thersia Flax  [address]  [place] [address]  Dept: [phone]       Patient: Jacqueline Simmons  Date of Birth: [birth year]  Patient Age: 43 y.o.  Patient Sex: female  MEDICAL RECORD [id number]  Visit Date: 12/30/2023    Family Practice Virtual Note    12/30/2023    TELEHEALTH EVALUATION -- Audio/Visual    Chief Complaint   Patient presents with    Lupus    muscle weakness    Chronic Pain           12/30/2023     5:49 PM   PHQ-9    Little interest or pleasure in doing things 1   Feeling down, depressed, or hopeless 1   PHQ-2 Score 2   PHQ-9 Total Score 2       History of Present Illness  The patient presents via virtual visit for evaluation of generalized pain, port malfunction, and medication management.    She reports experiencing widespread pain, extending from her toes to her scalp, which has significantly impacted her mobility. She requires assistance from her mother to perform basic tasks such as getting out of bed or using the bathroom at night. She also experienced a fall this morning but did not sustain any injuries. She expresses a desire to regain her previous level of activity and independence. She has been utilizing a wheelchair more frequently due to her condition. She recently traveled to [place], where she felt well on the first day, but her condition deteriorated over the following two days. She has been using a compression system for her left leg, which she finds beneficial, and is interested in exploring the possibility of a custom-made version because of the extensive skin grafting on the left lower leg. She has been using suppositories for pain management, which have proven effective.    She reports issues with her port, which requires significant effort to flush with saline. She recalls a previous visit to for blood work, during which the staff was unable to draw blood and referred her to the port lab.    She is  currently on oxycodone , Valium , metoprolol , pantoprazole , Zyrtec , and Benadryl . She reports no recent gastrointestinal issues. She is seeking refills for these medications.    No other complaints. Taking medications as prescribed. Medications reviewed and updated.     Jacqueline Simmons (DOB:  1981-05-31) has requested an audio/video evaluation for the following concern(s):    REVIEW OF SYSTEMS:  Review of systems is as indicated in the HPI otherwise negative.    Prior to Visit Medications    Medication Sig Taking? Authorizing Provider   Heparin  Sodium, Porcine, (HEPARIN , PORCINE,) 10000 UNIT/ML injection 0.5 mLs by IntraCATHeter route once as needed (blockage of port) Yes Rolfe Hartsell, Marit Sides, MD   ondansetron  (ZOFRAN -ODT) 4 MG disintegrating tablet Take 2 tablets by mouth every 8 hours as needed for Nausea or Vomiting Yes Deedee Lybarger, Marit Sides, MD   metoprolol  succinate (TOPROL  XL) 50 MG extended release tablet Take 1-2 tablets by mouth daily Tachycardia and malignant hypertension Yes Stephnie Parlier, Marit Sides, MD   pantoprazole  (PROTONIX ) 40 MG tablet Take 1 tablet by mouth every morning (before breakfast) Yes Jaksen Fiorella, Marit Sides, MD   diphenhydrAMINE  (BENADRYL ) 50 MG/ML injection INFUSE 1-2 ML INTRAVENOUSLY ONCE EVERY 4-6 HOURS AS NEEDED FOR ITCHING Yes Adhvik Canady, Marit Sides, MD   diazePAM  (VALIUM ) 10 MG tablet Take one tablet every 4 hours  as needed for anxiety Yes Judeen Geralds, Marit Sides, MD   oxyCODONE  (OXY-IR) 30 MG immediate release tablet Take 2 tablets by mouth every 4 hours as needed for Pain for up to 30 days. Max Daily Amount: 360 mg  Lorina Duffner Faith, MD   HYDROmorphone  (DILAUDID ) 3 MG suppository Place 1 suppository rectally every 6 hours as needed for Pain for up to 30 days. Max Daily Amount: 12 mg  Haeli Gerlich Faith, MD   tiZANidine  (ZANAFLEX ) 4 MG tablet Take 1-2 tablets every 8 hours as needed for muscle spasm  Lillian Ballester, Marit Sides, MD   sildenafil  (VIAGRA ) 100 MG tablet  Take 0.5 tablets by mouth 3 times daily as needed (Raynaud's disease)  Heer Justiss, Marit Sides, MD   rosuvastatin  (CRESTOR ) 5 MG tablet Take 1 tablet by mouth nightly For cholesterol  Falisha Osment, Marit Sides, MD   Plecanatide  (TRULANCE ) 3 MG TABS Take 1 tablet by mouth daily  Mirca Yale, Marit Sides, MD   cetirizine  (ZYRTEC ) 10 MG tablet Take 1 tablet by mouth daily For itching and rash  Nikky Duba, Marit Sides, MD   triamcinolone  (KENALOG ) 0.1 % cream Apply topically 2 times daily until clear  Kendle Erker, Marit Sides, MD   mycophenolate  (CELLCEPT ) 250 MG capsule Take 2 capsules by mouth 2 times daily  Charlotte Fidalgo, Marit Sides, MD   vitamin D  (ERGOCALCIFEROL ) 1.25 MG (50000 UT) CAPS capsule Take 1 capsule by mouth once a week  Brittannie Tawney, Marit Sides, MD   EPINEPHrine  (EPIPEN  2-PAK) 0.3 MG/0.3ML SOAJ injection Inject 0.3 mLs into the muscle once for 1 dose Use as directed for allergic reaction  Ivonne Freeburg, Marit Sides, MD   carbidopa -levodopa  (SINEMET ) 10-100 MG per tablet Take 1 tablet by mouth 2 times daily  Johanna Matto, Marit Sides, MD   folic acid  Take 5 mLs by mouth daily  Jermany Rimel, Marit Sides, MD   calcium -vitamin D  (OSCAL) 250-125 MG-UNIT per tablet Take 1 tablet by mouth daily  [provider]       Social History     Tobacco Use    Smoking status: Never    Smokeless tobacco: Never   Vaping Use    Vaping status: Never Used   Substance Use Topics    Alcohol use: Yes     Comment: Just on holidays or special occasions    Drug use: Never        Allergies   Allergen Reactions    Aspirin Hives, Itching, Rash, Shortness Of Breath and Swelling    Levaquin [Levofloxacin] Anaphylaxis    Bactrim [Sulfamethoxazole-Trimethoprim] Angioedema     Eyes swell    Nsaids Hives and Angioedema     Tongue swells    Toradol  [Ketorolac  Tromethamine ] Angioedema     Eyelids swell       PHYSICAL EXAMINATION:    ASSESSMENT/PLAN:  1. Lupus arthritis (HCC)  -     diazePAM  (VALIUM ) 10 MG tablet; Take one tablet every 4  hours as needed for anxiety, Disp-90 tablet, R-3Normal  -     [facility] - [facility]ational Therapy, [facility]  2. Parkinson's disease with dyskinesia and fluctuating manifestations (HCC)  -     [facility] - [facility]ational Therapy, [facility]  3. Raynaud's disease without gangrene  -     [facility] - [facility]ational Therapy, [facility]  4. Muscle weakness (generalized)  -     [facility] - [facility]ational Therapy, [facility]  5. Pain crisis  -     diazePAM  (VALIUM ) 10 MG  tablet; Take one tablet every 4 hours as needed for anxiety, Disp-90 tablet, R-3Normal  6. Muscle spasm  -     diazePAM  (VALIUM ) 10 MG tablet; Take one tablet every 4 hours as needed for anxiety, Disp-90 tablet, R-3Normal  7. Chronic nausea  -     ondansetron  (ZOFRAN -ODT) 4 MG disintegrating tablet; Take 2 tablets by mouth every 8 hours as needed for Nausea or Vomiting, Disp-60 tablet, R-5Normal  8. Gastroesophageal reflux disease without esophagitis  -     pantoprazole  (PROTONIX ) 40 MG tablet; Take 1 tablet by mouth every morning (before breakfast), Disp-30 tablet, R-5Normal  9. Seasonal allergic rhinitis due to pollen  -     diphenhydrAMINE  (BENADRYL ) 50 MG/ML injection; INFUSE 1-2 ML INTRAVENOUSLY ONCE EVERY 4-6 HOURS AS NEEDED FOR ITCHING, Disp-100 mL, R-5Normal  10. History of bone cancer in adulthood  81. Encounter for long-term (current) use of medications  12. Port-A-Cath in place  -     Heparin  Sodium, Porcine, (HEPARIN , PORCINE,) 10000 UNIT/ML injection; 0.5 mLs by IntraCATHeter route once as needed (blockage of port), Disp-4 mL, R-5Normal  13. Essential hypertension  -     metoprolol  succinate (TOPROL  XL) 50 MG extended release tablet; Take 1-2 tablets by mouth daily Tachycardia and malignant hypertension, Disp-60 tablet, R-5Normal      Assessment & Plan  1. Generalized pain.  - Reports pain from her toes to her scalp, making it difficult to move independently.  - Use of Dilaudid  has  been helping manage the pain.  - Referral for physical therapy will be initiated to help with strengthening and mobility.  - Possibility of using rails or trapeze bars to assist with movement in bed was discussed.    2. Port malfunction.  - Reports difficulty flushing her port with saline.  - Prescription for heparin  will be sent to pharmacy to help manage this issue.  - Advised to visit the cancer center or infusion center for a possible port flush.    3. Medication management.  - Refills for Zofran , oxycodone , Valium , metoprolol , pantoprazole , Zyrtec , and Benadryl  will be provided.  - Prescriptions will be sent to pharmacy to potentially reduce costs.    4. Physical therapy.  - Referral for physical therapy to help with strengthening and mobility.  - Discussed the possibility of custom compression garments for her leg.  - Considering both facility-based and home health physical therapy options.    Reviewed most recent labs.    Return for AS SCHEDULED.    Deitra Creasey, was evaluated through a synchronous (real-time) audio-video encounter. The patient (or guardian if applicable) is aware that this is a billable service, which includes applicable co-pays. This Virtual Visit was conducted with patient's (and/or legal guardian's) consent. Patient identification was verified, and a caregiver was present when appropriate.   The patient was located at Home: [address]  [place] [address]  Provider was located at [employer] (Appt Dept): [address]  [place],  [place] [address]  Confirm you are appropriately licensed, registered, or certified to deliver care in the state where the patient is located as indicated above. If you are not or unsure, please re-schedule the visit: Yes, I confirm.       --Heather Litter, MD on 12/30/2023 at 5:54 PM    An electronic signature was used to authenticate this note.    Thersia Flax, MD        ADDITIONAL EDUCATION    Last 7 days of labs:  No visits with  results within 1 Week(s) from this visit.   Latest known visit with results is:   Orders Only on 06/30/2023   Component Date Value Ref Range Status    Vit D, 25-Hydroxy 06/30/2023 32.4  30.0 - 100.0 ng/mL Final    Comment: Deficiency         <20.0 ng/mL  Insufficiency       20.0-29.9 ng/mL      T4 Free 06/30/2023 0.9  0.9 - 1.7 NG/DL Final    TSH, 3rd Generation 06/30/2023 1.660  0.270 - 4.200 uIU/mL Final    Cholesterol, Total 06/30/2023 215 (H)  0 - 200 MG/DL Final    Comment: Borderline High: 200-239 mg/dL  High: Greater than or equal to 240 mg/dL      Triglycerides 16/05/9603 238 (H)  0 - 150 MG/DL Final    Comment: Borderline High: 150-199 mg/dL, High: 540-981 mg/dL  Very High: Greater than or equal to 500 mg/dL      HDL 19/14/7829 39 (L)  40 - 60 MG/DL Final    LDL Cholesterol 06/30/2023 129 (H)  0 - 100 MG/DL Final    Comment: Near Optimal: 100-129 mg/dL  Borderline High: 562-130, High: 160-189 mg/dL  Very High: Greater than or equal to 190 mg/dL      VLDL Cholesterol Calculated 06/30/2023 48 (H)  6 - 23 MG/DL Final    Chol/HDL Ratio 06/30/2023 5.6 (H)  0.0 - 5.0   Final    Sodium 06/30/2023 140  136 - 145 mmol/L Final    Potassium 06/30/2023 4.8  3.5 - 5.1 mmol/L Final    Chloride 06/30/2023 102  98 - 107 mmol/L Final    CO2 06/30/2023 26  20 - 29 mmol/L Final    Anion Gap 06/30/2023 12  7 - 16 mmol/L Final    Glucose 06/30/2023 95  70 - 99 mg/dL Final    Comment: <86 mg/dL Consistent with, but not fully diagnostic of hypoglycemia.  100 - 125 mg/dL Impaired fasting glucose/consistent with pre-diabetes mellitus.  > 126 mg/dl Fasting glucose consistent with overt diabetes mellitus      BUN 06/30/2023 11  6 - 23 MG/DL Final    Creatinine 57/84/6962 1.06  0.60 - 1.10 MG/DL Final    Est, Glom Filt Rate 06/30/2023 67  >60 ml/min/1.59m2 Final    Comment:   Pediatric calculator link: https://www.kidney.org/professionals/kdoqi/gfr_calculatorped    These results are not intended for use in patients <18 years of  age.    eGFR results are calculated without a race factor using  the 2021 CKD-EPI equation. Careful clinical correlation is recommended, particularly when comparing to results calculated using previous equations.  The CKD-EPI equation is less accurate in patients with extremes of muscle mass, extra-renal metabolism of creatinine, excessive creatine ingestion, or following therapy that affects renal tubular secretion.      Calcium  06/30/2023 9.7  8.8 - 10.2 MG/DL Final    Total Bilirubin 06/30/2023 <0.2  0.0 - 1.2 MG/DL Final    ALT 95/28/4132 16  8 - 45 U/L Final    AST 06/30/2023 23  15 - 37 U/L Final    Alk Phosphatase 06/30/2023 107 (H)  35 - 104 U/L Final    Total Protein 06/30/2023 7.0  6.3 - 8.2 g/dL Final    Albumin 44/08/270 3.3 (L)  3.5 - 5.0 g/dL Final    Globulin 53/66/4403 3.7 (H)  2.3 - 3.5 g/dL Final    Albumin/Globulin Ratio 06/30/2023 0.9 (  L)  1.0 - 1.9   Final       Educational documents were provided including; but, not limited to diagnosis, prognosis, recurrent, complications, monitoring, instructions, prevention, etc.    Patient aware of all medications (prescribed and recommended). Patient verbalized understanding. Patient declined all other medications (OTC, provided by clinic and prescribed) as well as additional testing/imaging/diagnostics at this time. Patient aware of risks associated with declining treatment/recommendations and/or non-compliance with plan of care.    *Side effects, adverse effects, risks versus benefits associated with medications prescribed/recommended were discussed with the patient. Patient verbalized understanding. All questions answered.    *Patient was encouraged to return to the clinic and/or PCP. Or seek emergent care if worsening signs and symptoms warrant immediate evaluation including, but not limited to HA, blurred vision, facial asymmetry, speech disturbance, difficulty with ambulation/gait, numbness, tingling, weakness, syncope, chest pain (with or without  radiation), left arm pain, jaw pain, changes in hearing (loss), fever, unexplained sweating, malaise/fatigue, difficulty swallowing, mental changes (confusion, AMS), lightheadedness/dizziness, difficulty breathing, or shortness of breath.    I have reviewed the patient's medication list, past medical, family, social, and surgical history in detail and updated the patient record appropriately.    I have reviewed the patient's vital signs and discussed risks associated with any abnormal vital signs (during visit and following this visit) as well as appropriate parameters with the patient. Patient verbalized understanding. Patient agreed to seek emergent care if vital signs are above or below the parameters discussed.     Explanatory note: Be assured that the information provided to create your medical record comes from your provider. The written transcription portion of this note is prepared electronically by voice-recognition software. At times, there may be some errors in capitalization, punctuation, tense, or context that are inherent in the system, but your provider reviews the note for content and to ensure that the note contains appropriate information for your continuing care.    The patient (or guardian, if applicable) and other individuals in attendance with the patient were advised that Artificial Intelligence will be utilized during this visit to record, process the conversation to generate a clinical note, and support improvement of the AI technology. The patient (or guardian, if applicable) and other individuals in attendance at the appointment consented to the use of AI, including the recording.

## 2024-01-06 ENCOUNTER — Encounter

## 2024-01-07 MED ORDER — HYDROMORPHONE HCL 3 MG RE SUPP
3 | Freq: Four times a day (QID) | RECTAL | 0 refills | 7.00000 days | Status: DC | PRN
Start: 2024-01-07 — End: 2024-01-20

## 2024-01-12 ENCOUNTER — Encounter

## 2024-01-12 NOTE — Telephone Encounter (Signed)
 Patient is asking for a refill of oxycodone . Her pain is a 10/10 today. It has made her BP increase to 226/145 and her pulse is 143. She would like to change to Valium  to Xanax to see if it will help.

## 2024-01-13 MED ORDER — ALPRAZOLAM 2 MG PO TABS
2 | ORAL_TABLET | Freq: Four times a day (QID) | ORAL | 5 refills | 30.00 days | Status: AC | PRN
Start: 2024-01-13 — End: 2024-02-11

## 2024-01-13 MED ORDER — OXYCODONE HCL 30 MG PO TABS
30 | ORAL_TABLET | ORAL | 0 refills | 5.00000 days | Status: DC | PRN
Start: 2024-01-13 — End: 2024-01-16

## 2024-01-14 ENCOUNTER — Encounter

## 2024-01-16 ENCOUNTER — Encounter

## 2024-01-20 MED ORDER — OXYCODONE HCL 30 MG PO TABS
30 | ORAL_TABLET | ORAL | 0 refills | 5.00000 days | Status: DC | PRN
Start: 2024-01-20 — End: 2024-02-24

## 2024-01-20 MED ORDER — HYDROMORPHONE HCL 3 MG RE SUPP
3 | Freq: Four times a day (QID) | RECTAL | 0 refills | 7.00000 days | Status: DC | PRN
Start: 2024-01-20 — End: 2024-05-03

## 2024-01-21 ENCOUNTER — Telehealth

## 2024-01-21 NOTE — Telephone Encounter (Signed)
 She has a history of bone cancer.

## 2024-01-21 NOTE — Telephone Encounter (Signed)
 [facility] pharmacy [phone] the pharmacist called for a cancer diagnosis for hydromorphone  suppositories, and oxycodone 

## 2024-01-22 NOTE — Telephone Encounter (Signed)
 Called insurance, quantity limit requires form to be completed. Form has been faxed from [employer]. Will ask for expedited appeal.

## 2024-01-22 NOTE — Telephone Encounter (Signed)
 Spoke with Jacqueline Simmons, gave him dx code for bone cancer.   Patients insurance also has a quantity limit on hydromorphone  for 18 per 30 days. He is requesting appeal be done asap as patients insurance expires on May 31.   [employer] [phone]

## 2024-01-26 NOTE — Telephone Encounter (Signed)
 Form was not received in time, patient no longer has unsurance with [employer]

## 2024-02-24 ENCOUNTER — Encounter

## 2024-02-25 MED ORDER — DIPHENHYDRAMINE HCL 50 MG/ML IJ SOLN
50 | INTRAMUSCULAR | 5 refills | 15.00000 days | Status: DC
Start: 2024-02-25 — End: 2024-06-06

## 2024-02-25 MED ORDER — TIZANIDINE HCL 4 MG PO TABS
4 | ORAL_TABLET | ORAL | 5 refills | 30.00000 days | Status: DC
Start: 2024-02-25 — End: 2024-05-26

## 2024-02-25 MED ORDER — OXYCODONE HCL 30 MG PO TABS
30 | ORAL_TABLET | ORAL | 0 refills | 5.00000 days | Status: DC | PRN
Start: 2024-02-25 — End: 2024-03-04

## 2024-03-04 ENCOUNTER — Telehealth
Admit: 2024-03-04 | Discharge: 2024-03-04 | Payer: PRIVATE HEALTH INSURANCE | Attending: Family Medicine | Primary: Family Medicine

## 2024-03-04 DIAGNOSIS — M329 Systemic lupus erythematosus, unspecified: Principal | ICD-10-CM

## 2024-03-04 MED ORDER — OXYCODONE HCL 30 MG PO TABS
30 | ORAL_TABLET | ORAL | 0 refills | 5.00000 days | Status: AC | PRN
Start: 2024-03-04 — End: 2024-04-03

## 2024-03-04 MED ORDER — ROSUVASTATIN CALCIUM 5 MG PO TABS
5 | ORAL_TABLET | Freq: Every evening | ORAL | 5 refills | 90.00000 days | Status: AC
Start: 2024-03-04 — End: ?

## 2024-03-04 MED ORDER — AMOXICILLIN-POT CLAVULANATE 500-125 MG PO TABS
500-125 | ORAL_TABLET | Freq: Two times a day (BID) | ORAL | 0 refills | 10.00000 days | Status: AC
Start: 2024-03-04 — End: 2024-03-14

## 2024-03-04 NOTE — Progress Notes (Signed)
 [facility] SERVICES  [facility]  Dr. Asberry PHEBE Poet  [address]  [place] [address]  Dept: [phone]       Patient: Jacqueline Simmons  Date of Birth: 06-24-1981  Patient Age: 43 y.o.  Patient Sex: female  MEDICAL RECORD [id number]  Visit Date: 03/04/2024    Family Practice Virtual Note    03/04/2024    TELEHEALTH EVALUATION -- Audio/Visual    Chief Complaint   Patient presents with    Chronic Pain     She continues to have chronic multiple joint pain, especially in her legs and hips. She lost her job and insurance and could no longer get the Dilaudid  suppositories. The Oxycodone  calms down the pain enough so she can get through her day.     Fever     She had a temperature of 103 F earlier this week and today had a temperature of 100.2 F she has been coughing and wheezing and has some chest congestion.  She has been taking Robitussin which has helped some.    Hypertension     Her blood pressure has been very labile.  It goes up at times especially when she has a lot of pain and then will drop down very low.  She takes metoprolol  XL 50 mg once or twice daily.    Weight Loss     Her appetite has decreased and she is only eating a few child-size meals daily.  She has been drinking some Ensure.  She has lost 32 pounds since earlier this year.  She denies any abdominal pain, nausea, or vomiting.           12/30/2023     5:49 PM   PHQ-9    Little interest or pleasure in doing things 1   Feeling down, depressed, or hopeless 1   PHQ-2 Score 2   PHQ-9 Total Score 2       PATIENT REPORTED VITAL SIGNS:      03/04/2024     4:10 PM   Patient-Reported Vitals   Patient-Reported Weight 166   Patient-Reported Height 5,1"   Patient-Reported Systolic 187 mmHg   Patient-Reported Diastolic 163 mmHg   Patient-Reported Pulse 117   Patient-Reported Temperature 100.2        Jacqueline Simmons (DOB:  [birth year]) has requested an audio/video evaluation for the following concern(s):    History of Present  Illness  The patient presents via virtual visit for pain management, elevated blood pressure, fever, and weight loss. She is accompanied by her mother on the phone.    She continues to experience severe pain, which has led to her losing her job. Pain management has been challenging, and she has been using oxycodone  to function minimally. However, she reports that the medication induces drowsiness. Previous attempts to manage pain with fentanyl  patches were ineffective. She is due for a refill of her oxycodone  prescription. Her primary source of discomfort today is leg pain and hip pain.     Blood pressure readings have been inconsistent, with a recent reading of 80/50 and sometimes elevated at 200/100. Today, it is 187/163 and her pulse is 117. She reports feeling dizzy and weak when her blood pressure drops. She is currently on metoprolol  XL 50 mg for blood pressure management.    She has been experiencing a low-grade fever since Sunday, which she has been managing with Robitussin. Her mother reports that she had a high fever of 103 degrees earlier in  the week, which was managed with Tylenol . She has also been experiencing congestion, wheezing, and ear pain.    She has lost approximately 32 pounds since February due to a lack of appetite. She has been supplementing her diet with Ensure and consuming small meals.    No other complaints. Taking medications as prescribed. Medications reviewed and updated.       REVIEW OF SYSTEMS:  Review of systems is as indicated in the HPI otherwise negative.    Prior to Visit Medications    Medication Sig Taking? Authorizing Provider   amoxicillin-clavulanate (AUGMENTIN) 500-125 MG per tablet Take 1 tablet by mouth 2 times daily for 10 days Yes Jaskiran Pata, Asberry Dais, MD   oxyCODONE  (OXY-IR) 30 MG immediate release tablet Take 2 tablets by mouth every 4 hours as needed for Pain for up to 30 days. Max Daily Amount: 360 mg Yes Samanatha Brammer, Asberry Dais, MD   rosuvastatin  (CRESTOR ) 5  MG tablet Take 1 tablet by mouth nightly For cholesterol Yes Kristain Filo, Asberry Dais, MD   tiZANidine  (ZANAFLEX ) 4 MG tablet Take 1-2 tablets every 8 hours as needed for muscle spasm  Bradlee Bridgers, Asberry Dais, MD   diphenhydrAMINE  (BENADRYL ) 50 MG/ML injection INFUSE 1-2 ML INTRAVENOUSLY ONCE EVERY 4-6 HOURS AS NEEDED FOR ITCHING  Germani Gavilanes, Asberry Dais, MD   Heparin  Sodium, Porcine, (HEPARIN , PORCINE,) 10000 UNIT/ML injection 0.5 mLs by IntraCATHeter route once as needed (blockage of port)  Ximena Todaro, Asberry Dais, MD   ondansetron  (ZOFRAN -ODT) 4 MG disintegrating tablet Take 2 tablets by mouth every 8 hours as needed for Nausea or Vomiting  Logon Uttech, Asberry Dais, MD   metoprolol  succinate (TOPROL  XL) 50 MG extended release tablet Take 1-2 tablets by mouth daily Tachycardia and malignant hypertension  Shadee Rathod, Asberry Dais, MD   pantoprazole  (PROTONIX ) 40 MG tablet Take 1 tablet by mouth every morning (before breakfast)  Khailee Mick, Asberry Dais, MD   sildenafil  (VIAGRA ) 100 MG tablet Take 0.5 tablets by mouth 3 times daily as needed (Raynaud's disease)  Cornelius Schuitema, Asberry Dais, MD   Plecanatide  (TRULANCE ) 3 MG TABS Take 1 tablet by mouth daily  Panayiota Larkin, Asberry Dais, MD   cetirizine  (ZYRTEC ) 10 MG tablet Take 1 tablet by mouth daily For itching and rash  Krystena Reitter, Asberry Dais, MD   triamcinolone  (KENALOG ) 0.1 % cream Apply topically 2 times daily until clear  Dareen Gutzwiller, Asberry Dais, MD   mycophenolate  (CELLCEPT ) 250 MG capsule Take 2 capsules by mouth 2 times daily  Tevion Laforge, Asberry Dais, MD   vitamin D  (ERGOCALCIFEROL ) 1.25 MG (50000 UT) CAPS capsule Take 1 capsule by mouth once a week  Hlee Fringer, Asberry Dais, MD   EPINEPHrine  (EPIPEN  2-PAK) 0.3 MG/0.3ML SOAJ injection Inject 0.3 mLs into the muscle once for 1 dose Use as directed for allergic reaction  Sinead Hockman, Asberry Dais, MD   folic acid  Take 5 mLs by mouth daily  Clema Skousen, Asberry Dais, MD   calcium -vitamin D  (OSCAL) 250-125  MG-UNIT per tablet Take 1 tablet by mouth daily  [provider]       Social History     Tobacco Use    Smoking status: Never    Smokeless tobacco: Never   Vaping Use    Vaping status: Never Used   Substance Use Topics    Alcohol use: Yes     Comment: Just on holidays or special occasions    Drug use: Never        Allergies   Allergen Reactions    Aspirin Hives, Itching, Rash,  Shortness Of Breath and Swelling    Levaquin [Levofloxacin] Anaphylaxis    Bactrim [Sulfamethoxazole-Trimethoprim] Angioedema     Eyes swell    Nsaids Hives and Angioedema     Tongue swells    Toradol  [Ketorolac  Tromethamine ] Angioedema     Eyelids swell       PHYSICAL EXAMINATION:    ASSESSMENT/PLAN:  1. Lupus arthritis (HCC)  -     oxyCODONE  (OXY-IR) 30 MG immediate release tablet; Take 2 tablets by mouth every 4 hours as needed for Pain for up to 30 days. Max Daily Amount: 360 mg, Disp-180 tablet, R-0Normal  2. Malignant hypertension  3. Chest congestion  -     amoxicillin-clavulanate (AUGMENTIN) 500-125 MG per tablet; Take 1 tablet by mouth 2 times daily for 10 days, Disp-20 tablet, R-0Normal  4. Fever and chills  -     amoxicillin-clavulanate (AUGMENTIN) 500-125 MG per tablet; Take 1 tablet by mouth 2 times daily for 10 days, Disp-20 tablet, R-0Normal  5. Tachycardia  6. Bilateral leg pain  -     oxyCODONE  (OXY-IR) 30 MG immediate release tablet; Take 2 tablets by mouth every 4 hours as needed for Pain for up to 30 days. Max Daily Amount: 360 mg, Disp-180 tablet, R-0Normal  7. Bilateral hip pain  -     oxyCODONE  (OXY-IR) 30 MG immediate release tablet; Take 2 tablets by mouth every 4 hours as needed for Pain for up to 30 days. Max Daily Amount: 360 mg, Disp-180 tablet, R-0Normal  8. Muscle spasm  -     oxyCODONE  (OXY-IR) 30 MG immediate release tablet; Take 2 tablets by mouth every 4 hours as needed for Pain for up to 30 days. Max Daily Amount: 360 mg, Disp-180 tablet, R-0Normal  9. Elevated triglycerides with high  cholesterol  -     rosuvastatin  (CRESTOR ) 5 MG tablet; Take 1 tablet by mouth nightly For cholesterol, Disp-30 tablet, R-5Normal  10. Encounter for long-term (current) use of medications      Assessment & Plan  1. Pain management.  - Reports pain is still out of control and has been using oxycodone  to manage it.  - Unable to obtain Dilaudid  suppositories due to availability issues.  - Refill for oxycodone  will be provided.  - Advised to continue using oxycodone  as needed for pain relief.    2. Elevated blood pressure.  - Blood pressure significantly elevated today at 187/163, likely due to pain.  - Currently taking metoprolol .  - Advised to take an additional dose of metoprolol  to help lower blood pressure and pulse rate.  - Pulse is elevated, and a low-grade fever is present.    3. Fever.  - Low-grade fever of 100.13F, which started on Sunday.  - Has been taking Tylenol  to manage fever.  - Antibiotic prescription for Augmentin will be sent to pharmacy to address fever and potential underlying infection.  - Reports ear pain and congestion, suggesting a possible infection.    4. Weight loss.  - Lost about 32 pounds since February, likely due to decreased appetite and small meal intake.  - Currently drinking Ensure to supplement nutrition.  - Advised to continue with small, frequent meals and nutritional supplements like Ensure to maintain weight.  - No stomach pain reported, but overall decreased desire to eat.    Follow-up  - Follow-up appointment scheduled in 4 months.    Reviewed most recent labs.    Return in about 4 months (around 07/05/2024) for LABS BEFORE NEXT APPOINTMENT.  Jacqueline Simmons, was evaluated through a synchronous (real-time) audio-video encounter. The patient (or guardian if applicable) is aware that this is a billable service, which includes applicable co-pays. This Virtual Visit was conducted with patient's (and/or legal guardian's) consent. Patient identification was verified, and a caregiver  was present when appropriate.   The patient was located at Home: [address]  [place] [address]  Provider was located at [employer] (Appt Dept): [address]  [place],  [place] [address]  Confirm you are appropriately licensed, registered, or certified to deliver care in the state where the patient is located as indicated above. If you are not or unsure, please re-schedule the visit: Yes, I confirm.       Total time spent on this encounter: Not billed by time    --Asberry Gaylan Poet, MD on 03/04/2024 at 5:41 PM    An electronic signature was used to authenticate this note.    Asberry PHEBE Poet, MD        ADDITIONAL EDUCATION    Last 7 days of labs:    No visits with results within 1 Week(s) from this visit.   Latest known visit with results is:   Orders Only on 06/30/2023   Component Date Value Ref Range Status    Vit D, 25-Hydroxy 06/30/2023 32.4  30.0 - 100.0 ng/mL Final    Comment: Deficiency         <20.0 ng/mL  Insufficiency       20.0-29.9 ng/mL      T4 Free 06/30/2023 0.9  0.9 - 1.7 NG/DL Final    TSH, 3rd Generation 06/30/2023 1.660  0.270 - 4.200 uIU/mL Final    Cholesterol, Total 06/30/2023 215 (H)  0 - 200 MG/DL Final    Comment: Borderline High: 200-239 mg/dL  High: Greater than or equal to 240 mg/dL      Triglycerides 88/95/7975 238 (H)  0 - 150 MG/DL Final    Comment: Borderline High: 150-199 mg/dL, High: 799-500 mg/dL  Very High: Greater than or equal to 500 mg/dL      HDL 88/95/7975 39 (L)  40 - 60 MG/DL Final    LDL Cholesterol 06/30/2023 129 (H)  0 - 100 MG/DL Final    Comment: Near Optimal: 100-129 mg/dL  Borderline High: 869-840, High: 160-189 mg/dL  Very High: Greater than or equal to 190 mg/dL      VLDL Cholesterol Calculated 06/30/2023 48 (H)  6 - 23 MG/DL Final    Chol/HDL Ratio 06/30/2023 5.6 (H)  0.0 - 5.0   Final    Sodium 06/30/2023 140  136 - 145 mmol/L Final    Potassium 06/30/2023 4.8  3.5 - 5.1 mmol/L Final    Chloride 06/30/2023 102  98 - 107 mmol/L Final    CO2  06/30/2023 26  20 - 29 mmol/L Final    Anion Gap 06/30/2023 12  7 - 16 mmol/L Final    Glucose 06/30/2023 95  70 - 99 mg/dL Final    Comment: <29 mg/dL Consistent with, but not fully diagnostic of hypoglycemia.  100 - 125 mg/dL Impaired fasting glucose/consistent with pre-diabetes mellitus.  > 126 mg/dl Fasting glucose consistent with overt diabetes mellitus      BUN 06/30/2023 11  6 - 23 MG/DL Final    Creatinine 88/95/7975 1.06  0.60 - 1.10 MG/DL Final    Est, Glom Filt Rate 06/30/2023 67  >60 ml/min/1.96m2 Final    Comment:   Pediatric calculator link: https://www.kidney.org/professionals/kdoqi/gfr_calculatorped  These results are not intended for use in patients <78 years of age.    eGFR results are calculated without a race factor using  the 2021 CKD-EPI equation. Careful clinical correlation is recommended, particularly when comparing to results calculated using previous equations.  The CKD-EPI equation is less accurate in patients with extremes of muscle mass, extra-renal metabolism of creatinine, excessive creatine ingestion, or following therapy that affects renal tubular secretion.      Calcium  06/30/2023 9.7  8.8 - 10.2 MG/DL Final    Total Bilirubin 06/30/2023 <0.2  0.0 - 1.2 MG/DL Final    ALT 88/95/7975 16  8 - 45 U/L Final    AST 06/30/2023 23  15 - 37 U/L Final    Alk Phosphatase 06/30/2023 107 (H)  35 - 104 U/L Final    Total Protein 06/30/2023 7.0  6.3 - 8.2 g/dL Final    Albumin 88/95/7975 3.3 (L)  3.5 - 5.0 g/dL Final    Globulin 88/95/7975 3.7 (H)  2.3 - 3.5 g/dL Final    Albumin/Globulin Ratio 06/30/2023 0.9 (L)  1.0 - 1.9   Final       Educational documents were provided including; but, not limited to diagnosis, prognosis, recurrent, complications, monitoring, instructions, prevention, etc.    Patient aware of all medications (prescribed and recommended). Patient verbalized understanding. Patient declined all other medications (OTC, provided by clinic and prescribed) as well as additional  testing/imaging/diagnostics at this time. Patient aware of risks associated with declining treatment/recommendations and/or non-compliance with plan of care.    *Side effects, adverse effects, risks versus benefits associated with medications prescribed/recommended were discussed with the patient. Patient verbalized understanding. All questions answered.    *Patient was encouraged to return to the clinic and/or PCP. Or seek emergent care if worsening signs and symptoms warrant immediate evaluation including, but not limited to HA, blurred vision, facial asymmetry, speech disturbance, difficulty with ambulation/gait, numbness, tingling, weakness, syncope, chest pain (with or without radiation), left arm pain, jaw pain, changes in hearing (loss), fever, unexplained sweating, malaise/fatigue, difficulty swallowing, mental changes (confusion, AMS), lightheadedness/dizziness, difficulty breathing, or shortness of breath.    I have reviewed the patient's medication list, past medical, family, social, and surgical history in detail and updated the patient record appropriately.    I have reviewed the patient's vital signs and discussed risks associated with any abnormal vital signs (during visit and following this visit) as well as appropriate parameters with the patient. Patient verbalized understanding. Patient agreed to seek emergent care if vital signs are above or below the parameters discussed.     Explanatory note: Be assured that the information provided to create your medical record comes from your provider. The written transcription portion of this note is prepared electronically by voice-recognition software. At times, there may be some errors in capitalization, punctuation, tense, or context that are inherent in the system, but your provider reviews the note for content and to ensure that the note contains appropriate information for your continuing care.    The patient (or guardian, if applicable) and other  individuals in attendance with the patient were advised that Artificial Intelligence will be utilized during this visit to record, process the conversation to generate a clinical note, and support improvement of the AI technology. The patient (or guardian, if applicable) and other individuals in attendance at the appointment consented to the use of AI, including the recording.

## 2024-04-07 ENCOUNTER — Encounter

## 2024-04-07 MED ORDER — ALPRAZOLAM 2 MG PO TABS
2 | ORAL_TABLET | Freq: Four times a day (QID) | ORAL | 5 refills | 30.00000 days | Status: DC | PRN
Start: 2024-04-07 — End: 2024-08-05

## 2024-04-07 MED ORDER — OXYCODONE HCL 30 MG PO TABS
30 | ORAL_TABLET | ORAL | 0 refills | 5.00000 days | Status: DC | PRN
Start: 2024-04-07 — End: 2024-05-05

## 2024-04-07 MED ORDER — TRAMADOL HCL 50 MG PO TABS
50 | ORAL_TABLET | Freq: Four times a day (QID) | ORAL | 5 refills | 30.00000 days | Status: AC | PRN
Start: 2024-04-07 — End: 2024-05-07

## 2024-04-07 NOTE — Addendum Note (Signed)
 Addended by: BONNI STABS F on: 04/07/2024 03:55 PM     Modules accepted: Orders

## 2024-04-07 NOTE — Telephone Encounter (Signed)
 Pending

## 2024-05-03 ENCOUNTER — Telehealth

## 2024-05-03 MED ORDER — OXYCODONE-ACETAMINOPHEN 5-325 MG PO TABS
5-325 | ORAL_TABLET | Freq: Four times a day (QID) | ORAL | 0 refills | Status: DC | PRN
Start: 2024-05-03 — End: 2024-05-05

## 2024-05-03 MED ORDER — HYDROMORPHONE HCL 3 MG RE SUPP
3 | Freq: Four times a day (QID) | RECTAL | 0 refills | Status: DC | PRN
Start: 2024-05-03 — End: 2024-05-05

## 2024-05-03 NOTE — Telephone Encounter (Signed)
 Patient's mother called and stated that the pharmacist informed her that the oxycodone  was on back order. She wants to go back to percocet. He also has some dilaudid  suppositories in stock. She needs a refill on that as well. Her mother also states that she broke another bone. She broke her right wrist recently. She has broken 4 bones this year. She has a history of bone cancer.

## 2024-05-05 ENCOUNTER — Encounter

## 2024-05-05 MED ORDER — HYDROMORPHONE HCL 3 MG RE SUPP
3 | Freq: Four times a day (QID) | RECTAL | 0 refills | 7.00000 days | Status: DC | PRN
Start: 2024-05-05 — End: 2024-05-26

## 2024-05-05 MED ORDER — OXYCODONE-ACETAMINOPHEN 5-325 MG PO TABS
5-325 | ORAL_TABLET | Freq: Four times a day (QID) | ORAL | 0 refills | 5.00000 days | Status: DC | PRN
Start: 2024-05-05 — End: 2024-05-26

## 2024-05-26 ENCOUNTER — Encounter

## 2024-05-26 MED ORDER — OXYCODONE-ACETAMINOPHEN 5-325 MG PO TABS
5-325 | ORAL_TABLET | Freq: Four times a day (QID) | ORAL | 0 refills | 5.00000 days | Status: DC | PRN
Start: 2024-05-26 — End: 2024-05-27

## 2024-05-26 MED ORDER — TIZANIDINE HCL 4 MG PO TABS
4 | ORAL_TABLET | ORAL | 5 refills | 30.00000 days | Status: AC
Start: 2024-05-26 — End: ?

## 2024-05-26 MED ORDER — HYDROMORPHONE HCL 3 MG RE SUPP
3 | Freq: Four times a day (QID) | RECTAL | 0 refills | 14.00000 days | Status: DC | PRN
Start: 2024-05-26 — End: 2024-06-11

## 2024-05-26 NOTE — Telephone Encounter (Signed)
 Patient needs refills

## 2024-05-27 ENCOUNTER — Telehealth

## 2024-05-27 NOTE — Telephone Encounter (Signed)
 Patient's mother called to ask if the Percocet can be changed from 5/325 mg to 10/325 mg. She was taking 2 of the 5/325 mg tablets at once. She is worried about the Tylenol .

## 2024-05-28 MED ORDER — OXYCODONE-ACETAMINOPHEN 10-325 MG PO TABS
10-325 | ORAL_TABLET | ORAL | 0 refills | 6.00000 days | Status: DC | PRN
Start: 2024-05-28 — End: 2024-07-02

## 2024-06-06 ENCOUNTER — Encounter

## 2024-06-07 ENCOUNTER — Encounter

## 2024-06-07 MED ORDER — FOLIC ACID 0.2 MG/ML PO SOLN
Freq: Every day | 5 refills | Status: AC
Start: 2024-06-07 — End: ?

## 2024-06-07 MED ORDER — SILDENAFIL CITRATE 100 MG PO TABS
100 | ORAL_TABLET | Freq: Three times a day (TID) | ORAL | 5 refills | 20.00000 days | Status: AC | PRN
Start: 2024-06-07 — End: ?

## 2024-06-07 MED ORDER — PANTOPRAZOLE SODIUM 40 MG PO TBEC
40 | ORAL_TABLET | Freq: Every day | ORAL | 5 refills | 30.00000 days | Status: AC
Start: 2024-06-07 — End: ?

## 2024-06-07 MED ORDER — EPINEPHRINE 0.3 MG/0.3ML IJ SOAJ
0.3 | Freq: Once | INTRAMUSCULAR | 5 refills | Status: AC
Start: 2024-06-07 — End: 2024-06-06

## 2024-06-07 MED ORDER — DIPHENHYDRAMINE HCL 50 MG/ML IJ SOLN
50 | INTRAMUSCULAR | 5 refills | 7.00000 days | Status: DC
Start: 2024-06-07 — End: 2024-06-11

## 2024-06-07 MED ORDER — VITAMIN D (ERGOCALCIFEROL) 1.25 MG (50000 UT) PO CAPS
1.25 | ORAL_CAPSULE | ORAL | 3 refills | 84.00000 days | Status: AC
Start: 2024-06-07 — End: ?

## 2024-06-07 NOTE — Telephone Encounter (Signed)
 Labs ordered

## 2024-06-08 MED ORDER — FOLIC ACID 200 MCG/ML PO LIQD
200 | Freq: Every day | ORAL | 5 refills | Status: AC
Start: 2024-06-08 — End: ?

## 2024-06-11 ENCOUNTER — Telehealth: Admit: 2024-06-11 | Discharge: 2024-06-16 | Attending: Family Medicine | Primary: Family Medicine

## 2024-06-11 MED ORDER — PREDNISONE 20 MG PO TABS
20 | ORAL_TABLET | ORAL | 0 refills | 5.00000 days | Status: AC
Start: 2024-06-11 — End: ?

## 2024-06-11 MED ORDER — DIPHENHYDRAMINE HCL 50 MG/ML IJ SOLN
50 | INTRAMUSCULAR | 5 refills | 4.00000 days | Status: DC
Start: 2024-06-11 — End: 2024-07-12

## 2024-06-11 MED ORDER — PROPRANOLOL HCL 60 MG PO TABS
60 | ORAL_TABLET | Freq: Three times a day (TID) | ORAL | 5 refills | 67.50000 days | Status: AC
Start: 2024-06-11 — End: ?

## 2024-06-11 MED ORDER — HYDROMORPHONE HCL 3 MG RE SUPP
3 | Freq: Four times a day (QID) | RECTAL | 0 refills | 7.00000 days | Status: DC | PRN
Start: 2024-06-11 — End: 2024-06-21

## 2024-06-11 MED ORDER — CLONAZEPAM 1 MG PO TABS
1 | ORAL_TABLET | Freq: Two times a day (BID) | ORAL | 5 refills | 30.00000 days | Status: DC | PRN
Start: 2024-06-11 — End: 2024-08-05

## 2024-06-11 NOTE — Progress Notes (Signed)
 "  [facility] SERVICES  [facility]  Dr. Asberry PHEBE Poet  [address]  [place] [address]  Dept: [phone]       Patient: Jacqueline Simmons  Date of Birth: 08/31/1980  Patient Age: 43 y.o.  Patient Sex: female  MEDICAL RECORD [id number]  Visit Date: 06/11/2024    Family Practice Virtual Note    06/11/2024    TELEHEALTH EVALUATION -- Audio/Visual    Chief Complaint   Patient presents with    Tachycardia     Her blood pressure elevates when she is in severe pain. This week it went up to 183. She takes Metoprolol  but it is not helping.    Lupus     She has severe joint pain from the lupus and rashes. She has oral and vaginal rashes. Her mother is an [employer] and gives her benadryl  IV which seems to help. She is trying to get in to see a rheumatologist.     Chronic Pain     She has been in and out of pain crises all week. She uses Dilaudid  suppositories to help the pain when it gets severe.     Anxiety     Her anxiety gets worse during the pain crisis. The Xanax  is not holding her. Her mother is wondering if we can add something or increase the dose of Xanax .    Seizures     She has been having seizures recently when her pain gets severe. Her mother is now having to sleep in her room due to the seizures.    Weight Loss     She continues to lose weight. She is currently 163 lbs. She has been having abdominal cramping and vomiting. She is eating smaller meals and drinking protein shakes such as Ensure.           12/30/2023     5:49 PM   PHQ-9    Little interest or pleasure in doing things 1   Feeling down, depressed, or hopeless 1   PHQ-2 Score 2    PHQ-9 Total Score 2        Data saved with a previous flowsheet row definition       PATIENT REPORTED VITAL SIGNS:      06/11/2024     2:10 PM   Patient-Reported Vitals   Patient-Reported Weight 163 lbs   Patient-Reported Height 5' 1   Patient-Reported Pulse 183        Jacqueline Simmons (DOB:  08-22-1981) has requested an audio/video evaluation for the  following concern(s):    History of Present Illness  The patient presents for evaluation of lupus, seizures, supraventricular tachycardia, and anxiety.    She has been experiencing significant weight loss, currently weighing around 163 pounds. This is attributed to her inability to consume large meals due to nausea and subsequent vomiting. To manage this, she has been eating small portions and supplementing with protein shakes and Ensure. She has been dealing with recurrent rashes and ulcers on her lips and gums. She is unable to see her rheumatologist at this time. She is considering switching from CellCept  to an alternative medication. She has previously tried Plaquenil, which resulted in severe diarrhea.    Her pain levels have escalated over the past few weeks, leading to seizures when the pain becomes unbearable. These seizures typically last between 30 seconds to 2 minutes, with instances of up to 4 or 5 seizures in one night. She has been using  Dilaudid  suppositories for pain management, but they are not always effective. She believes that better pain control could prevent the seizures.    She experienced an episode of supraventricular tachycardia (SVT) recently, with a heart rate of 183, which was managed by calming her down and controlling her breathing. She also takes Metoprolol . However, her heart rate remains high. She is concerned about the risk of a heart attack. Metoprolol  has not been effective in reducing her heart rate.    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 She is currently taking Xanax  2 mg.    Diet: Eating small portions, supplementing with protein shakes and Ensure    No other complaints. Taking medications as prescribed. Medications reviewed and updated.       REVIEW OF SYSTEMS:  Review of systems is as indicated in the HPI otherwise negative.    No visits with results within 1 Week(s) from this visit.   Latest known visit  with results is:   Orders Only on 06/30/2023   Component Date Value Ref Range Status    Vit D, 25-Hydroxy 06/30/2023 32.4  30.0 - 100.0 ng/mL Final    Comment: Deficiency         <20.0 ng/mL  Insufficiency       20.0-29.9 ng/mL      T4 Free 06/30/2023 0.9  0.9 - 1.7 NG/DL Final    TSH, 3rd Generation 06/30/2023 1.660  0.270 - 4.200 uIU/mL Final    Cholesterol, Total 06/30/2023 215 (H)  0 - 200 MG/DL Final    Comment: Borderline High: 200-239 mg/dL  High: Greater than or equal to 240 mg/dL      Triglycerides 88/95/7975 238 (H)  0 - 150 MG/DL Final    Comment: Borderline High: 150-199 mg/dL, High: 799-500 mg/dL  Very High: Greater than or equal to 500 mg/dL      HDL 88/95/7975 39 (L)  40 - 60 MG/DL Final    LDL Cholesterol 06/30/2023 129 (H)  0 - 100 MG/DL Final    Comment: Near Optimal: 100-129 mg/dL  Borderline High: 869-840, High: 160-189 mg/dL  Very High: Greater than or equal to 190 mg/dL      VLDL Cholesterol Calculated 06/30/2023 48 (H)  6 - 23 MG/DL Final    Chol/HDL Ratio 06/30/2023 5.6 (H)  0.0 - 5.0   Final    Sodium 06/30/2023 140  136 - 145 mmol/L Final    Potassium 06/30/2023 4.8  3.5 - 5.1 mmol/L Final    Chloride 06/30/2023 102  98 - 107 mmol/L Final    CO2 06/30/2023 26  20 - 29 mmol/L Final    Anion Gap 06/30/2023 12  7 - 16 mmol/L Final    Glucose 06/30/2023 95  70 - 99 mg/dL Final    Comment: <29 mg/dL Consistent with, but not fully diagnostic of hypoglycemia.  100 - 125 mg/dL Impaired fasting glucose/consistent with pre-diabetes mellitus.  > 126 mg/dl Fasting glucose consistent with overt diabetes mellitus      BUN 06/30/2023 11  6 - 23 MG/DL Final    Creatinine 88/95/7975 1.06  0.60 - 1.10 MG/DL Final    Est, Glom Filt Rate 06/30/2023 67  >60 ml/min/1.45m2 Final    Comment:   Pediatric calculator link: https://www.kidney.org/professionals/kdoqi/gfr_calculatorped    These results are not intended for use in patients <2 years of age.    eGFR results are calculated without a race  factor using  the  2021 CKD-EPI equation. Careful clinical correlation is recommended, particularly when comparing to results calculated using previous equations.  The CKD-EPI equation is less accurate in patients with extremes of muscle mass, extra-renal metabolism of creatinine, excessive creatine ingestion, or following therapy that affects renal tubular secretion.      Calcium  06/30/2023 9.7  8.8 - 10.2 MG/DL Final    Total Bilirubin 06/30/2023 <0.2  0.0 - 1.2 MG/DL Final    ALT 88/95/7975 16  8 - 45 U/L Final    AST 06/30/2023 23  15 - 37 U/L Final    Alk Phosphatase 06/30/2023 107 (H)  35 - 104 U/L Final    Total Protein 06/30/2023 7.0  6.3 - 8.2 g/dL Final    Albumin 88/95/7975 3.3 (L)  3.5 - 5.0 g/dL Final    Globulin 88/95/7975 3.7 (H)  2.3 - 3.5 g/dL Final    Albumin/Globulin Ratio 06/30/2023 0.9 (L)  1.0 - 1.9   Final       Prior to Visit Medications   Medication Sig Taking? Authorizing Provider   HYDROmorphone  (DILAUDID ) 3 MG suppository Place 1 suppository rectally every 6 hours as needed for Pain for up to 30 days. Max Daily Amount: 12 mg Yes Pascha Fogal, Asberry Dais, MD   diphenhydrAMINE  (BENADRYL ) 50 MG/ML injection INFUSE 1-2 ML INTRAVENOUSLY ONCE EVERY 4-6 HOURS AS NEEDED FOR ITCHING Yes Kalicia Dufresne, Asberry Dais, MD   propranolol  (INDERAL ) 60 MG immediate release tablet Take 1 tablet by mouth 3 times daily For tachycardia Yes Mamie Diiorio, Asberry Dais, MD   predniSONE  (DELTASONE ) 20 MG tablet 3 tablets daily x 3 days then 2 tablets daily x 3 days then 1 tablet daily x 3 days then 1/2 tablet daily x 6 days Yes Lourine Alberico, Asberry Dais, MD   clonazePAM  (KLONOPIN ) 1 MG tablet Take 1 tablet by mouth 2 times daily as needed for Anxiety for up to 30 days. Max Daily Amount: 2 mg Yes Evalynne Locurto, Asberry Dais, MD   Folic Acid  200 MCG/ML LIQD Take 5 mLs by mouth daily  Karlyn Glasco, Asberry Dais, MD   folic acid  Take 5 mLs by mouth daily  Virginie Josten, Asberry Dais, MD   EPINEPHrine  (EPIPEN  2-PAK) 0.3 MG/0.3ML SOAJ injection Inject  0.3 mLs into the muscle once for 1 dose Use as directed for allergic reaction  Renn Dirocco, Asberry Dais, MD   vitamin D  (ERGOCALCIFEROL ) 1.25 MG (50000 UT) CAPS capsule Take 1 capsule by mouth once a week  Viha Kriegel, Asberry Dais, MD   sildenafil  (VIAGRA ) 100 MG tablet Take 0.5 tablets by mouth 3 times daily as needed (Raynaud's disease)  Tayvin Preslar, Asberry Dais, MD   pantoprazole  (PROTONIX ) 40 MG tablet Take 1 tablet by mouth every morning (before breakfast)  Halley Shepheard, Asberry Dais, MD   oxyCODONE -acetaminophen  (PERCOCET) 10-325 MG per tablet Take 1 tablet by mouth every 4 hours as needed for Pain for up to 30 days. Max Daily Amount: 6 tablets  Mujahid Jalomo, Asberry Dais, MD   tiZANidine  (ZANAFLEX ) 4 MG tablet Take 1-2 tablets every 8 hours as needed for muscle spasm  Yuya Vanwingerden, Asberry Dais, MD   rosuvastatin  (CRESTOR ) 5 MG tablet Take 1 tablet by mouth nightly For cholesterol  Kaja Jackowski, Asberry Dais, MD   Heparin  Sodium, Porcine, (HEPARIN , PORCINE,) 10000 UNIT/ML injection 0.5 mLs by IntraCATHeter route once as needed (blockage of port)  Areana Kosanke, Asberry Dais, MD   ondansetron  (ZOFRAN -ODT) 4 MG disintegrating tablet Take 2 tablets by mouth every 8 hours as needed for Nausea or Vomiting  Bonni Asberry Dais, MD   metoprolol  succinate (TOPROL  XL) 50 MG extended release tablet Take 1-2 tablets by mouth daily Tachycardia and malignant hypertension  Dionisios Ricci, Asberry Dais, MD   Plecanatide  (TRULANCE ) 3 MG TABS Take 1 tablet by mouth daily  Hellena Pridgen, Asberry Dais, MD   cetirizine  (ZYRTEC ) 10 MG tablet Take 1 tablet by mouth daily For itching and rash  Doshie Maggi, Asberry Dais, MD   triamcinolone  (KENALOG ) 0.1 % cream Apply topically 2 times daily until clear  Raylyn Carton, Asberry Dais, MD   mycophenolate  (CELLCEPT ) 250 MG capsule Take 2 capsules by mouth 2 times daily  Reyna Lorenzi, Asberry Dais, MD   calcium -vitamin D  (OSCAL) 250-125 MG-UNIT per tablet Take 1 tablet by mouth daily  [provider]       Social History     Tobacco Use    Smoking status: Never    Smokeless tobacco: Never   Vaping Use    Vaping status: Never Used   Substance Use Topics    Alcohol use: Yes     Comment: Just on holidays or special occasions    Drug use: Never        Allergies   Allergen Reactions    Aspirin Hives, Itching, Rash, Shortness Of Breath and Swelling    Levaquin [Levofloxacin] Anaphylaxis    Bactrim [Sulfamethoxazole-Trimethoprim] Angioedema     Eyes swell    Nsaids Hives and Angioedema     Tongue swells    Toradol  [Ketorolac  Tromethamine ] Angioedema     Eyelids swell       PHYSICAL EXAMINATION:    ASSESSMENT/PLAN:  1. Lupus arthritis (HCC)  -     HYDROmorphone  (DILAUDID ) 3 MG suppository; Place 1 suppository rectally every 6 hours as needed for Pain for up to 30 days. Max Daily Amount: 12 mg, Disp-6 suppository, R-0Normal  -     [facility] - Probasco, Letty, MD, Rheumatology, [place]  -     predniSONE  (DELTASONE ) 20 MG tablet; 3 tablets daily x 3 days then 2 tablets daily x 3 days then 1 tablet daily x 3 days then 1/2 tablet daily x 6 days, Disp-20 tablet, R-0Normal  2. Chronic joint pain  3. Pain crisis  -     HYDROmorphone  (DILAUDID ) 3 MG suppository; Place 1 suppository rectally every 6 hours as needed for Pain for up to 30 days. Max Daily Amount: 12 mg, Disp-6 suppository, R-0Normal  -     [facility] - Probasco, Letty, MD, Rheumatology, [place]  -     predniSONE  (DELTASONE ) 20 MG tablet; 3 tablets daily x 3 days then 2 tablets daily x 3 days then 1 tablet daily x 3 days then 1/2 tablet daily x 6 days, Disp-20 tablet, R-0Normal  -     clonazePAM  (KLONOPIN ) 1 MG tablet; Take 1 tablet by mouth 2 times daily as needed for Anxiety for up to 30 days. Max Daily Amount: 2 mg, Disp-60 tablet, R-5Normal  4. Muscle weakness (generalized)  5. Tachycardia  -     propranolol  (INDERAL ) 60 MG immediate release tablet; Take 1 tablet by mouth 3 times daily For tachycardia, Disp-90 tablet, R-5Normal  6. Weight loss  7. Seasonal allergic  rhinitis due to pollen  -     diphenhydrAMINE  (BENADRYL ) 50 MG/ML injection; INFUSE 1-2 ML INTRAVENOUSLY ONCE EVERY 4-6 HOURS AS NEEDED FOR ITCHING, Disp-100 mL, R-5Normal  8. Anxiety  -     clonazePAM  (KLONOPIN ) 1 MG tablet; Take 1 tablet by mouth 2 times daily as needed for  Anxiety for up to 30 days. Max Daily Amount: 2 mg, Disp-60 tablet, R-5Normal  9. Encounter for long-term (current) use of medications      Assessment & Plan  1. Lupus:  - Severe pain, rashes, and ulcers in her lip and gum indicate a lupus flare-up.  - A referral to Dr. Alexandro Lu, a new rheumatologist, has been made for urgent consultation.  - Diphenhydramine  has been prescribed to manage her rashes. Prednisone  dosage will be tapered as follows: 60 mg for 3 days, 40 mg for 3 days, 20 mg for 3 days, and then 10 mg for 6 days.    2. Seizures:  - Seizures are likely triggered by uncontrolled pain.  - Dilaudid  suppositories will be continued for pain management. A prescription for additional Dilaudid  suppositories has been sent to her pharmacy.    3. Supraventricular tachycardia (SVT):  - She experienced an episode where her heart rate reached 180 bpm.  - Propranolol  60 mg immediate release has been prescribed to manage her SVT.    4. Anxiety:  - Klonopin  twice daily has been added to her regimen, and Xanax  will be taken as needed. 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    Reviewed most recent labs.    Return for AS SCHEDULED.    Kairah Dotzler, was evaluated through a synchronous (real-time) audio-video encounter. The patient (or guardian if applicable) is aware that this is a billable service, which includes applicable co-pays. This Virtual Visit was conducted with patient's (and/or legal guardian's) consent. Patient identification was verified, and a caregiver was present when appropriate.   The patient was located at Home: [address]  [place] [address]  Provider was located at  Facility (Appt Dept): [address]  [place],  [place] [address]  Confirm you are appropriately licensed, registered, or certified to deliver care in the state where the patient is located as indicated above. If you are not or unsure, please re-schedule the visit: Yes, I confirm.       Total time spent on this encounter: Not billed by time    --Asberry Gaylan Poet, MD on 06/11/2024 at 2:14 PM    An electronic signature was used to authenticate this note.    Asberry PHEBE Poet, MD    ADDITIONAL EDUCATION    Educational documents were provided including; but, not limited to diagnosis, prognosis, recurrent, complications, monitoring, instructions, prevention, etc.    Patient aware of all medications (prescribed and recommended). Patient verbalized understanding. Patient declined all other medications (OTC, provided by clinic and prescribed) as well as additional testing/imaging/diagnostics at this time. Patient aware of risks associated with declining treatment/recommendations and/or non-compliance with plan of care.    *Side effects, adverse effects, risks versus benefits associated with medications prescribed/recommended were discussed with the patient. Patient verbalized understanding. All questions answered.    *Patient was encouraged to return to the clinic and/or PCP. Or seek emergent care if worsening signs and symptoms warrant immediate evaluation including, but not limited to HA, blurred vision, facial asymmetry, speech disturbance, difficulty with ambulation/gait, numbness, tingling, weakness, syncope, chest pain (with or without radiation), left arm pain, jaw pain, changes in hearing (loss), fever, unexplained sweating, malaise/fatigue, difficulty swallowing, mental changes (confusion, AMS), lightheadedness/dizziness, difficulty breathing, or shortness of breath.    I have reviewed the patient's medication list, past medical, family, social, and surgical history in detail and updated the patient record  appropriately.  I have reviewed the patient's vital signs and discussed risks associated with any abnormal vital signs (during visit and following this visit) as well as appropriate parameters with the patient. Patient verbalized understanding. Patient agreed to seek emergent care if vital signs are above or below the parameters discussed.     Explanatory note: Be assured that the information provided to create your medical record comes from your provider. The written transcription portion of this note is prepared electronically by voice-recognition software. At times, there may be some errors in capitalization, punctuation, tense, or context that are inherent in the system, but your provider reviews the note for content and to ensure that the note contains appropriate information for your continuing care.    The patient (or guardian, if applicable) and other individuals in attendance with the patient were advised that Artificial Intelligence will be utilized during this visit to record, process the conversation to generate a clinical note, and support improvement of the AI technology. The patient (or guardian, if applicable) and other individuals in attendance at the appointment consented to the use of AI, including the recording.                "

## 2024-06-18 ENCOUNTER — Telehealth

## 2024-06-18 MED ORDER — LEVETIRACETAM 500 MG PO TABS
500 | ORAL_TABLET | Freq: Two times a day (BID) | ORAL | 5 refills | 30.00000 days | Status: AC
Start: 2024-06-18 — End: ?

## 2024-06-18 NOTE — Telephone Encounter (Signed)
"  Patient's mother states that she has been having seizures recently. They usually occur when she is in severe pain from the lupus.  Will start Keppra .  "

## 2024-06-21 ENCOUNTER — Encounter

## 2024-06-21 MED ORDER — HYDROMORPHONE HCL 3 MG RE SUPP
3 | Freq: Four times a day (QID) | RECTAL | 0 refills | 7.00000 days | Status: DC | PRN
Start: 2024-06-21 — End: 2024-08-13

## 2024-06-21 NOTE — Telephone Encounter (Signed)
"  Patient is having severe pain since having a seizure, falling off her bed and splitting open her leg wound.  The pain is causing the seizures. Will send Dilaudid  supp to the pharmacy.  "

## 2024-07-02 ENCOUNTER — Encounter

## 2024-07-02 MED ORDER — OXYCODONE-ACETAMINOPHEN 10-325 MG PO TABS
10-325 | ORAL_TABLET | ORAL | 0 refills | 6.00000 days | Status: DC | PRN
Start: 2024-07-02 — End: 2024-08-01

## 2024-07-12 ENCOUNTER — Encounter

## 2024-07-12 MED ORDER — DIPHENHYDRAMINE HCL 50 MG/ML IJ SOLN
50 | INTRAMUSCULAR | 5 refills | 4.00000 days | Status: DC
Start: 2024-07-12 — End: 2024-08-05

## 2024-08-01 ENCOUNTER — Encounter

## 2024-08-03 MED ORDER — OXYCODONE-ACETAMINOPHEN 10-325 MG PO TABS
10-325 | ORAL_TABLET | ORAL | 0 refills | Status: AC | PRN
Start: 2024-08-03 — End: 2024-09-01

## 2024-08-05 ENCOUNTER — Encounter

## 2024-08-05 MED ORDER — ALPRAZOLAM 2 MG PO TABS
2 | ORAL_TABLET | Freq: Four times a day (QID) | ORAL | 5 refills | 30.00000 days | Status: DC | PRN
Start: 2024-08-05 — End: 2024-08-24

## 2024-08-05 MED ORDER — DIPHENHYDRAMINE HCL 50 MG/ML IJ SOLN
50 | INTRAMUSCULAR | 5 refills | 7.00000 days | Status: DC
Start: 2024-08-05 — End: 2024-09-21

## 2024-08-05 NOTE — Telephone Encounter (Signed)
"  Patient states that the Klonopin  has not been helping. She is asking to be put back on the Xanax . She also needs a refill of the injectable Benadryl . The pain is causing her to have seizures.   "

## 2024-08-13 ENCOUNTER — Encounter

## 2024-08-13 MED ORDER — HYDROMORPHONE HCL 3 MG RE SUPP
3 | Freq: Four times a day (QID) | RECTAL | 0 refills | 7.00000 days | Status: DC | PRN
Start: 2024-08-13 — End: 2024-09-29

## 2024-08-13 MED ORDER — QUETIAPINE FUMARATE 25 MG PO TABS
25 | ORAL_TABLET | Freq: Two times a day (BID) | ORAL | 5 refills | 30.00000 days | Status: AC
Start: 2024-08-13 — End: ?

## 2024-08-13 NOTE — Telephone Encounter (Signed)
"  Patient's mother would like for her to try Seroquel  25 mg bid for anxiety and insomnia.   "

## 2024-08-24 ENCOUNTER — Telehealth

## 2024-08-24 MED ORDER — ALPRAZOLAM 2 MG PO TABS
2 | ORAL_TABLET | Freq: Four times a day (QID) | ORAL | 5 refills | 30.00000 days | Status: AC | PRN
Start: 2024-08-24 — End: 2024-09-23

## 2024-08-24 MED ORDER — QUETIAPINE FUMARATE 100 MG PO TABS
100 | ORAL_TABLET | Freq: Every evening | ORAL | 5 refills | 30.00000 days | Status: AC
Start: 2024-08-24 — End: ?

## 2024-08-24 NOTE — Telephone Encounter (Signed)
"  She is having worsening pain and that makes her anxiety worse. She also starts to have seizures when her pain is not under control.  Will increase the Seroquel  to 100 mg at night and keep the 25 mg during the day.  Will increase the Xanax  2 mg to #120.  "

## 2024-09-13 ENCOUNTER — Encounter

## 2024-09-13 MED ORDER — OXYCODONE-ACETAMINOPHEN 10-325 MG PO TABS
10-325 | ORAL_TABLET | ORAL | 0 refills | 7.00000 days | Status: DC | PRN
Start: 2024-09-13 — End: 2024-09-30

## 2024-09-13 NOTE — Telephone Encounter (Signed)
"  Requested medication pended for PCP approval.    "

## 2024-09-21 ENCOUNTER — Encounter

## 2024-09-24 MED ORDER — DIPHENHYDRAMINE HCL 50 MG/ML IJ SOLN
50 | INTRAMUSCULAR | 5 refills | Status: AC
Start: 2024-09-24 — End: ?

## 2024-09-24 MED ORDER — AUGMENTIN 125-31.25 MG/5ML PO SUSR
125-31.25 | Freq: Two times a day (BID) | ORAL | 0 refills | 10.00000 days | Status: AC
Start: 2024-09-24 — End: 2024-10-04

## 2024-09-24 NOTE — Telephone Encounter (Signed)
 She is complaining of ear pain and drainage.

## 2024-09-29 ENCOUNTER — Encounter

## 2024-09-29 MED ORDER — HYDROMORPHONE HCL 3 MG RE SUPP
3 | Freq: Four times a day (QID) | RECTAL | 0 refills | Status: AC | PRN
Start: 2024-09-29 — End: 2024-10-29

## 2024-09-29 NOTE — Telephone Encounter (Signed)
 Patient continues to have severe pain. When the pain escalates, she has seizures. She is asking for some more of the dilaudid  suppositories.

## 2024-09-30 ENCOUNTER — Telehealth

## 2024-09-30 MED ORDER — OXYCODONE-ACETAMINOPHEN 10-325 MG PO TABS
10-325 | ORAL_TABLET | ORAL | 0 refills | Status: AC | PRN
Start: 2024-09-30 — End: 2024-10-30

## 2024-09-30 NOTE — Telephone Encounter (Signed)
 Patient's mother called and stated that her pain crisis is getting worse. Sometimes she has to give her 2 of the percocet to get the pain to ease.   Will update prescription so she can take 1 or 2 tablets every 4 hours.
